# Patient Record
Sex: Female | Born: 1970 | Race: White | Hispanic: No | Marital: Single | Smoking: Former smoker
Health system: Southern US, Community
[De-identification: ages and names within clinical notes are randomized; demographics above are authoritative.]

## PROBLEM LIST (undated history)

## (undated) DIAGNOSIS — F988 Other specified behavioral and emotional disorders with onset usually occurring in childhood and adolescence: Secondary | ICD-10-CM

## (undated) DIAGNOSIS — J301 Allergic rhinitis due to pollen: Secondary | ICD-10-CM

## (undated) DIAGNOSIS — N898 Other specified noninflammatory disorders of vagina: Secondary | ICD-10-CM

## (undated) DIAGNOSIS — Z8052 Family history of malignant neoplasm of bladder: Secondary | ICD-10-CM

## (undated) DIAGNOSIS — Q625 Duplication of ureter: Secondary | ICD-10-CM

## (undated) DIAGNOSIS — E785 Hyperlipidemia, unspecified: Secondary | ICD-10-CM

## (undated) DIAGNOSIS — I10 Essential (primary) hypertension: Secondary | ICD-10-CM

## (undated) DIAGNOSIS — R87619 Unspecified abnormal cytological findings in specimens from cervix uteri: Secondary | ICD-10-CM

## (undated) DIAGNOSIS — E669 Obesity, unspecified: Secondary | ICD-10-CM

## (undated) DIAGNOSIS — K219 Gastro-esophageal reflux disease without esophagitis: Secondary | ICD-10-CM

## (undated) DIAGNOSIS — Z8632 Personal history of gestational diabetes: Secondary | ICD-10-CM

## (undated) DIAGNOSIS — D219 Benign neoplasm of connective and other soft tissue, unspecified: Secondary | ICD-10-CM

## (undated) DIAGNOSIS — F419 Anxiety disorder, unspecified: Secondary | ICD-10-CM

## (undated) DIAGNOSIS — B001 Herpesviral vesicular dermatitis: Secondary | ICD-10-CM

## (undated) DIAGNOSIS — Z800 Family history of malignant neoplasm of digestive organs: Secondary | ICD-10-CM

## (undated) DIAGNOSIS — N840 Polyp of corpus uteri: Secondary | ICD-10-CM

## (undated) DIAGNOSIS — C19 Malignant neoplasm of rectosigmoid junction: Secondary | ICD-10-CM

## (undated) HISTORY — DX: Anxiety disorder, unspecified: F41.9

## (undated) HISTORY — PX: COLONOSCOPY: SHX174

## (undated) HISTORY — DX: Obesity, unspecified: E66.9

## (undated) HISTORY — DX: Other specified noninflammatory disorders of vagina: N89.8

## (undated) HISTORY — DX: Unspecified abnormal cytological findings in specimens from cervix uteri: R87.619

## (undated) HISTORY — DX: Gastro-esophageal reflux disease without esophagitis: K21.9

## (undated) HISTORY — DX: Herpesviral vesicular dermatitis: B00.1

## (undated) HISTORY — DX: Essential (primary) hypertension: I10

## (undated) HISTORY — DX: Family history of malignant neoplasm of bladder: Z80.52

## (undated) HISTORY — PX: WISDOM TOOTH EXTRACTION: SHX21

## (undated) HISTORY — PX: TYMPANOSTOMY: SHX2586

## (undated) HISTORY — DX: Personal history of gestational diabetes: Z86.32

## (undated) HISTORY — DX: Family history of malignant neoplasm of digestive organs: Z80.0

## (undated) HISTORY — DX: Allergic rhinitis due to pollen: J30.1

## (undated) HISTORY — DX: Other specified behavioral and emotional disorders with onset usually occurring in childhood and adolescence: F98.8

## (undated) HISTORY — DX: Polyp of corpus uteri: N84.0

## (undated) HISTORY — DX: Benign neoplasm of connective and other soft tissue, unspecified: D21.9

## (undated) HISTORY — DX: Duplication of ureter: Q62.5

## (undated) HISTORY — DX: Hyperlipidemia, unspecified: E78.5

---
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
## 1999-04-12 ENCOUNTER — Other Ambulatory Visit: Admission: RE | Admit: 1999-04-12 | Discharge: 1999-04-12 | Payer: Self-pay | Admitting: Gynecology

## 2001-04-10 ENCOUNTER — Other Ambulatory Visit: Admission: RE | Admit: 2001-04-10 | Discharge: 2001-04-10 | Payer: Self-pay | Admitting: Family Medicine

## 2002-07-23 ENCOUNTER — Other Ambulatory Visit: Admission: RE | Admit: 2002-07-23 | Discharge: 2002-07-23 | Payer: Self-pay | Admitting: Internal Medicine

## 2004-06-17 ENCOUNTER — Ambulatory Visit: Payer: Self-pay | Admitting: Gynecology

## 2005-03-07 HISTORY — PX: DILATION AND CURETTAGE OF UTERUS: SHX78

## 2005-11-23 ENCOUNTER — Ambulatory Visit: Payer: Self-pay | Admitting: Gynecology

## 2006-01-30 ENCOUNTER — Encounter (INDEPENDENT_AMBULATORY_CARE_PROVIDER_SITE_OTHER): Payer: Self-pay | Admitting: Gynecology

## 2006-01-30 ENCOUNTER — Ambulatory Visit: Payer: Self-pay | Admitting: Gynecology

## 2006-04-07 ENCOUNTER — Ambulatory Visit: Payer: Self-pay | Admitting: Gynecology

## 2006-11-08 ENCOUNTER — Ambulatory Visit: Payer: Self-pay | Admitting: Obstetrics & Gynecology

## 2007-01-24 ENCOUNTER — Ambulatory Visit: Payer: Self-pay | Admitting: Gynecology

## 2007-02-06 ENCOUNTER — Encounter: Payer: Self-pay | Admitting: Obstetrics & Gynecology

## 2007-02-06 ENCOUNTER — Ambulatory Visit: Payer: Self-pay | Admitting: Family Medicine

## 2007-07-23 ENCOUNTER — Ambulatory Visit: Payer: Self-pay | Admitting: Gynecology

## 2008-02-20 ENCOUNTER — Encounter: Payer: Self-pay | Admitting: Obstetrics and Gynecology

## 2008-02-20 ENCOUNTER — Encounter (INDEPENDENT_AMBULATORY_CARE_PROVIDER_SITE_OTHER): Payer: Self-pay | Admitting: Gynecology

## 2008-02-20 ENCOUNTER — Ambulatory Visit: Payer: Self-pay | Admitting: Obstetrics and Gynecology

## 2008-02-20 LAB — CONVERTED CEMR LAB: HCV Ab: NEGATIVE

## 2008-10-01 ENCOUNTER — Ambulatory Visit: Payer: Self-pay | Admitting: Obstetrics and Gynecology

## 2008-10-01 ENCOUNTER — Encounter: Payer: Self-pay | Admitting: Obstetrics & Gynecology

## 2008-10-01 LAB — CONVERTED CEMR LAB: Free T4: 0.93 ng/dL (ref 0.80–1.80)

## 2009-02-23 ENCOUNTER — Ambulatory Visit: Payer: Self-pay | Admitting: Obstetrics and Gynecology

## 2009-02-23 LAB — CONVERTED CEMR LAB: HCV Ab: NEGATIVE

## 2009-02-24 ENCOUNTER — Encounter: Payer: Self-pay | Admitting: Obstetrics and Gynecology

## 2009-03-31 ENCOUNTER — Ambulatory Visit (HOSPITAL_COMMUNITY): Admission: RE | Admit: 2009-03-31 | Discharge: 2009-03-31 | Payer: Self-pay | Admitting: Urology

## 2010-01-12 ENCOUNTER — Ambulatory Visit: Payer: Self-pay | Admitting: Family Medicine

## 2010-03-02 ENCOUNTER — Ambulatory Visit: Payer: Self-pay | Admitting: Obstetrics & Gynecology

## 2010-03-17 ENCOUNTER — Ambulatory Visit (HOSPITAL_COMMUNITY)
Admission: RE | Admit: 2010-03-17 | Discharge: 2010-03-17 | Payer: Self-pay | Source: Home / Self Care | Attending: Family Medicine | Admitting: Family Medicine

## 2010-03-29 LAB — COMPREHENSIVE METABOLIC PANEL
Albumin: 4.5
CO2: 29 mmol/L
Calcium: 9.6 mg/dL
Chloride: 100 mmol/L
Total Protein ELP: 6.8

## 2010-03-29 LAB — LIPID PANEL
Direct LDL: 222
HDL: 51 mg/dL (ref 35–70)

## 2010-03-30 ENCOUNTER — Encounter
Admission: RE | Admit: 2010-03-30 | Discharge: 2010-03-30 | Payer: Self-pay | Source: Home / Self Care | Attending: Obstetrics & Gynecology | Admitting: Obstetrics & Gynecology

## 2010-04-01 ENCOUNTER — Other Ambulatory Visit: Payer: Self-pay | Admitting: Obstetrics & Gynecology

## 2010-04-01 DIAGNOSIS — N6320 Unspecified lump in the left breast, unspecified quadrant: Secondary | ICD-10-CM

## 2010-07-20 NOTE — Assessment & Plan Note (Signed)
NAME:  Colleen Boone, Colleen Boone NO.:  [id number]   MEDICAL RECORD NO.:  [id number]          PATIENT TYPE:  POB   LOCATION:  [facility] at [place]         FACILITY:  [facility]   PHYSICIAN:  Allie Bossier, MD        DATE OF BIRTH:  [birth year]   DATE OF SERVICE:                                  CLINIC NOTE   HISTORY OF PRESENT ILLNESS:  Kanija is a 40 year old divorced white  gravida 2, para 1, abortus 1 woman with a 3 year old son.  She comes in  here for annual exam.  She has no GYN complaints.   PAST MEDICAL HISTORY:  Vaginal cyst previously evaluated by Dr.  McDiarmid with an MRI at [facility] recently.  No workup is necessary  per him, and she is asymptomatic with regard to it.  Current obesity and  history of anxiety and elevated cholesterol.   Her primary care doctor is [facility] in [place].   FAMILY HISTORY:  Positive for stroke in 2 grandmothers and coronary  artery disease in multiple relatives.  She denies family history of  breast, GYN, or colon malignancies.  She had a history of Bell's palsy  in 2003.   PAST SURGICAL HISTORY:  A T and A at age 72 and elective AB done via D  and C in 2006.   REVIEW OF SYSTEMS:  Abstinent since September 2011.  She has bisexual  relationship.  She is an Nutritional therapist at [employer]. A history of  sclerotherapy this year.   SOCIAL HISTORY:  No drug allergies.  She has a latex sensitivity.   MEDICATION:  Xanax on a p.r.n. basis.  She says this was used rarely.  She is on her second Mirena IUD.  Zantac 150 mg daily p.r.n., Valtrex  p.r.n. for cold sores, hydrochlorothiazide 25 mg on a p.r.n. basis (for  swelling).   PHYSICAL EXAMINATION:  GENERAL:  Well-nourished, well-hydrated pleasant  white female.  VITAL SIGNS:  Height 5 feet 4 inches, weight 180, blood pressure 117/94,  and pulse 74.  HEENT:  Normal.  BREASTS:  Normal bilaterally.  HEART:  Regular rate and rhythm.  LUNGS:  Clear to auscultation bilaterally.  ABDOMEN:   Obese and benign.  No palpable hepatosplenomegaly.  EXTERNAL GENITALIA:  Shaved.  No lesions.  Cervix parous.  IUD string  seen.  No ectropion is noted.  No abnormalities are visualized.  She has  first degree to second degree prolapse of her uterus and mild cystocele.  Bimanual exam reveals a normal size and shape, anteverted mobile uterus,  and nonenlarged adnexa.   ASSESSMENT AND PLAN:  Annual exam, check Pap smear, and recommended self-  breast and self-vulvar exams.  We are scheduling a mammogram next month.  Recommended weight loss and that she followup with her family doctor or  her primary care doctor regarding her cholesterol.      Allie Bossier, MD     MCD/MEDQ  D:  03/02/2010  T:  03/03/2010  Job:  [id number]

## 2010-07-20 NOTE — Assessment & Plan Note (Signed)
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  She has had an last examination, a  normal examination, in December 2009 with no significant serious  problems, but she finds that she is becoming very anxious and cannot  really work.  She has had some hair loss.  She is having difficulty  sleeping.  She generally just does not feel well and would like some  help.  In addition to this, although she is not sexually active, she is  questioning about permanent sterilization.  At her age, if she does get  into a relationship, she does not want the chance of having a child.  I  have told her that since she is not in a relationship, why not wait  without doing something permanent at this point, use something like the  NuvaRing, she could not tolerate the oral contraceptives, for a very  short period of time and then think about something like Essure if she  decides on something permanent in the future.  That seemed to be  satisfactory for her.  I told her we would also check her thyroid  because of symptoms she is describing, all could be related to that, so  that screening will be done.  In addition, we have talked about a trial  of low-dose SSRI and we are going to put her on Zoloft 50.  I have told  her about the side effects and the possibility that that might not be  the correct one and we may have to do some switching to find the  appropriate one.  I would think that if the condition continues and  everything, no other symptoms appear that it is possible that a  professional psychological counseling might be  appropriate, but not at  this time.  My impression is mild depression with anxiety and  contraceptive counseling.           ______________________________  Argentina Donovan, MD     PR/MEDQ  D:  10/01/2008  T:  10/02/2008  Job:  [id number]

## 2010-07-20 NOTE — Assessment & Plan Note (Signed)
NAME:  Colleen Boone, PEGG NO.:  [id number]   MEDICAL RECORD NO.:  [id number]          PATIENT TYPE:  POB   LOCATION:  [facility]         FACILITY:  [facility]   PHYSICIAN:  Tinnie Gens, MD        DATE OF BIRTH:  08/06/1970   DATE OF SERVICE:  01/12/2010                                  CLINIC NOTE   CHIEF COMPLAINT:  IUD insertion.   HISTORY OF PRESENT ILLNESS:  The patient is a 40 year old gravida 1,  para 1 who presented to emergency room at [facility].  She has  had heavy cycles for the last 4 years.  She desires Mirena  contraception.  She has had a Mirena in before and had spotting with it;  however, her cycles have gotten so heavy since she has been off it, that  she would like to return to it.  She states she can put with some  spotting as long as her cycles are not quite so heavy.  She has been  significantly heavy over the last 4-5 years, but they were always heavy,  especially when she was a younger child.  This does not seem to be a  truly new phenomenon for her, but she liked her cycles better when she  had the IUD in place.   PHYSICAL EXAMINATION:  VITAL SIGNS:  Her vitals are as noted in the  chart.  GENERAL:  She is a well-developed, well-nourished woman in no acute  distress.  ABDOMEN:  Soft, nontender, nondistended.  GU:  Normal external female genitalia.  BUS normal.  Vagina is pink and  rugated.  Cervix is parous without lesion.   PROCEDURE:  The patient's cervix is cleaned with Betadine x2.  It was  grasped anteriorly with single-tooth tenaculum.  Attempt was made to  pass the sound and this could not be done easily so an os  finder was  used to gain entry into the uterine cavity.  The uterus then sounded to  8 cm.  A Mirena IUD was placed without difficulty.  Strings were trimmed  to 2.5 cm.  The patient tolerated the procedure well.   IMPRESSION:  Need for contraception as well as cycle control.   PLAN:  Status post IUD  insertion.   PLAN:  Return in 4-6 weeks for IUD check, as well as for Pap smear.  Should the patient continue to have abnormal cycles, consider  endometrial sampling.           ______________________________  Tinnie Gens, MD     TP/MEDQ  D:  01/12/2010  T:  01/13/2010  Job:  [id number]

## 2010-07-20 NOTE — Assessment & Plan Note (Signed)
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  She had some problem last year with hair  loss, which seems to be abated now and some anxiety, and she is put on  SSRIs, now on Celexa; and she is doing very well.  She had a Mirena in  the past and was not really thrilled with that and is using NuvaRing at  the present time, although she says that she has no use for it because  she has changed partners.  In any case, she is in for her annual  examination, she has no significant complaints.  No known allergies and  except for the Celexa, the only thing she is on is p.r.n. Valtrex, and  she does take hydrochlorothiazide 25 a day for hypertension.   PHYSICAL EXAMINATION:  VITAL SIGNS:  Today, her blood pressure is 124/87  and pulse is 80, the weight 164, heights 5 feet 4 inches tall.  GENERAL:  A well-developed and well-nourished white female in no acute  distress.  HEENT:  Within normal limits.  NECK:  Supple.  Thyroid symmetrical.  BACK:  Erect.  LUNGS:  Clear to auscultation and percussion.  HEART:  No murmur.  Normal sinus rhythm.  BREASTS:  Symmetrical, somewhat pendulous, large, no axillary or  supraclavicular nodes.  No nipple discharge.  ABDOMEN:  Soft and nontender.  No masses or organomegaly.  EXTREMITIES:  No edema.  No varicosities.  NEUROLOGIC:  DTRs within normal limits.  GENITALIA:  External genitalia is normal.  BUS is notable by swelling  underneath the urethra measuring approximately 3 cm and soft in nature.  I think this is diverticulum, although she seems to be asymptomatic from  this.  Vagina  is clean and well rugated.  The cervix is clean whereas  the uterus is anterior normal size, shape, consistency.  Adnexa could  not be well outlined.   IMPRESSION:  Normal physical examination with the exception of swelling  under the urethra.  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  I am going to get a urine culture and sensitivity to  make sure that; and I have suggested she sees a urologist.  I have  suggested Dr. Alfredo Martinez, and she has not decided on which she  wants to go to, and she also wanted to be screened for sexually  transmitted diseases.  GC and chlamydia were done and the blood work for  hepatitis, syphilis, and human immunodeficiency virus will be done.           ______________________________  Argentina Donovan, MD    PR/MEDQ  D:  02/23/2009  T:  02/24/2009  Job:  [id number]

## 2010-07-20 NOTE — Assessment & Plan Note (Signed)
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  The patient's only complaint was of the  small tiny lesion on her left labia minorus.   PHYSICAL EXAMINATION:  THYROID:  Symmetrical with no dominant masses.  NECK:  Supple.  BREASTS:  Symmetrical with no dominant masses.  No nipple discharge.  ABDOMEN:  Soft, benign, and nontender.  No masses or organomegaly.  EXTERNAL GENITALIA:  Normal.  The exception of a tiny little sebaceous  cyst at the lower pole of the left labia minorus and  BUS within normal  limits.  Introitus marital.  The cervix is clean and parous.  Uterus is  anterior, normal size, shape, and consistency.  The adnexa is normal.  VITAL SIGNS:  The patient's blood pressure 122/83 with a weight of 168  and height of 5 feet 4 inches tall with a pulse of 72.   The patient had previously been on Loestrin 24 Fe.  She is not sexually  active at this point and so she stopped her medication.  Previous Mirena  was removed in last summer because of spotting.  The patient in good and  virtually normal at gynecological examination.           ______________________________  Argentina Donovan, MD     PR/MEDQ  D:  02/20/2008  T:  02/21/2008  Job:  [id number]

## 2010-07-20 NOTE — Assessment & Plan Note (Signed)
NAME:  [name], [name] NO.:  [id number]   MEDICAL RECORD NO.:  [id number]          PATIENT TYPE:  POB   LOCATION:  [facility]         FACILITY:  [facility]   PHYSICIAN:  Ginger Carne, MD DATE OF BIRTH:  12-24-1970   DATE OF SERVICE:  07/23/2007                                  CLINIC NOTE   Colleen Boone returns today to have her Mirena intrauterine device  removed.  The patient has had in for two years principally for birth  control measures. She has had intermittent spotting which she dislikes.  Intrauterine device was removed without difficulty.  The patient was  started on Loestrin FE 24.  She was counseled that she may have some  irregular bleeding for the next month or so due to the changeover in  form of contraception.  The patient will contact us if she has any  further questions.  She is not due for her next Pap smear until December  2009.           ______________________________  Ginger Carne, MD     SHB/MEDQ  D:  07/23/2007  T:  07/23/2007  Job:  [id number]

## 2010-12-16 ENCOUNTER — Encounter: Payer: Self-pay | Admitting: Family Medicine

## 2010-12-16 ENCOUNTER — Ambulatory Visit (INDEPENDENT_AMBULATORY_CARE_PROVIDER_SITE_OTHER): Payer: 59 | Admitting: Family Medicine

## 2010-12-16 VITALS — BP 106/70 | HR 84 | Temp 98.6°F | Ht 63.0 in | Wt 193.2 lb

## 2010-12-16 DIAGNOSIS — K219 Gastro-esophageal reflux disease without esophagitis: Secondary | ICD-10-CM | POA: Insufficient documentation

## 2010-12-16 DIAGNOSIS — Z833 Family history of diabetes mellitus: Secondary | ICD-10-CM | POA: Insufficient documentation

## 2010-12-16 DIAGNOSIS — E785 Hyperlipidemia, unspecified: Secondary | ICD-10-CM | POA: Insufficient documentation

## 2010-12-16 DIAGNOSIS — Z8249 Family history of ischemic heart disease and other diseases of the circulatory system: Secondary | ICD-10-CM

## 2010-12-16 DIAGNOSIS — E669 Obesity, unspecified: Secondary | ICD-10-CM

## 2010-12-16 DIAGNOSIS — I10 Essential (primary) hypertension: Secondary | ICD-10-CM

## 2010-12-16 LAB — COMPREHENSIVE METABOLIC PANEL
Albumin: 4.1 g/dL (ref 3.5–5.2)
CO2: 27 mEq/L (ref 19–32)
Calcium: 9.1 mg/dL (ref 8.4–10.5)
GFR: 87.91 mL/min (ref >60.00)
Glucose, Bld: 95 mg/dL (ref 70–99)
Potassium: 3.8 mEq/L (ref 3.5–5.1)
Sodium: 136 mEq/L (ref 135–145)
Total Bilirubin: 0.8 mg/dL (ref 0.3–1.2)
Total Protein: 7.2 g/dL (ref 6.0–8.3)

## 2010-12-16 LAB — LIPID PANEL: Triglycerides: 121 mg/dL (ref 0.0–149.0)

## 2010-12-16 LAB — TSH: TSH: 1.66 u[IU]/mL (ref 0.35–5.50)

## 2010-12-16 NOTE — Assessment & Plan Note (Signed)
Chronic. Stable. No change today.  States has enough bp meds to last until next visit.

## 2010-12-16 NOTE — Progress Notes (Addendum)
Subjective:    Patient ID: Colleen Boone, female    DOB: 11-29-1970, 40 y.o.   MRN: [id number]  HPI CC: new pt, establish  Weight gain, feeling fuller in abdomen.  previously saw Colleen Boone.  1 HTN - recently restarted lisinopril.  Prior, bp running 130/90s.  H/o edema since pregnancy, h/o GDM, on HCTZ and recently worsening.  2. Weight gain - 20 lbs up in last 1 year.  Back in school - finishing BS.  No heat or cold intolerance, skin or hair changes, diarrhea constipation.  No fatigue.  Occasional eczema.  Feels abd fullness that leads to SOB.  Mother with NASH with varices.  3. HLD - told too high in past, thinks LDL 200s, HDL too low.  Will request records.  H/o depression/anxiety, tapered off sertraline recently.  Caffeine: 1 cup coffee/day Lives with son (1998), 2 dogs Occupation: Charity fundraiser at [employer], working on [employer] Activity: gym 2-3x/wk, cardio, back in school Diet: not lots of vegetables/fruits.  Not much water.  Preventative: Tetanus - utd from work. Ppd every year for work. Flu shot last week. Well woman at [facility] [place]).  1 abnl pap years ago, normal.  Normal mammograms, [place] ok this year. CPE - saw Dr. Zenaida Niece at [facility], but she left.  Medications and allergies reviewed and updated in chart.  Past histories reviewed and updated if relevant as below. Patient Active Problem List  Diagnoses  . HTN (hypertension)  . HLD (hyperlipidemia)   Past Medical History  Diagnosis Date  . History of gestational diabetes   . Anxiety   . GERD (gastroesophageal reflux disease)   . Hay fever   . HTN (hypertension)   . HLD (hyperlipidemia)   . Accessory ureter   . Uterine polyp    Past Surgical History  Procedure Date  . Tonsillectomy 1975   History  Substance Use Topics  . Smoking status: Never Smoker   . Smokeless tobacco: Never Used  . Alcohol Use: Yes     2 glasses red wine nightly   Family History  Problem Relation Age of Onset  .  Hypertension Mother   . Diabetes Mother   . Coronary artery disease Father 65  . Cancer Father     lymphoma  . Hypertension Father   . Hyperlipidemia Father   . Diabetes Father   . Diabetes Brother   . Cancer Maternal Uncle 82    colon  . Stroke Maternal Grandmother     post card cath  . Cancer Maternal Grandmother     leukemia  . Hypertension Maternal Grandmother   . Hypertension Maternal Grandfather   . Stroke Paternal Grandmother   . Hypertension Paternal Grandmother   . Stroke Paternal Grandfather   . Hypertension Paternal Grandfather    Allergies  Allergen Reactions  . Latex Rash  . Aleve (All Day Pain)     Hot flashes   No current outpatient prescriptions on file prior to visit.   Review of Systems  Constitutional: Negative for fever, chills, activity change, appetite change, fatigue and unexpected weight change.  HENT: Negative for hearing loss and neck pain.   Eyes: Negative for visual disturbance.  Respiratory: Positive for shortness of breath. Negative for cough, chest tightness and wheezing.   Cardiovascular: Positive for leg swelling. Negative for chest pain and palpitations.  Gastrointestinal: Negative for nausea, vomiting, abdominal pain, diarrhea, constipation, blood in stool and abdominal distention.  Genitourinary: Negative for hematuria and difficulty urinating.  Musculoskeletal: Negative for myalgias and arthralgias.  Skin: Negative for rash.  Neurological: Negative for dizziness, seizures, syncope and headaches.  Hematological: Does not bruise/bleed easily.  Psychiatric/Behavioral: Negative for dysphoric mood. The patient is not nervous/anxious.        Objective:   Physical Exam  Nursing note and vitals reviewed. Constitutional: She is oriented to person, place, and time. She appears well-developed and well-nourished. No distress.  HENT:  Head: Normocephalic and atraumatic.  Right Ear: External ear normal.  Left Ear: External ear normal.    Nose: Nose normal.  Mouth/Throat: Oropharynx is clear and moist. No oropharyngeal exudate.  Eyes: Conjunctivae and EOM are normal. Pupils are equal, round, and reactive to light. No scleral icterus.  Neck: Normal range of motion. Neck supple. No thyromegaly present.  Cardiovascular: Normal rate, regular rhythm, normal heart sounds and intact distal pulses.   No murmur heard. Pulses:      Radial pulses are 2+ on the right side, and 2+ on the left side.  Pulmonary/Chest: Effort normal and breath sounds normal. No respiratory distress. She has no wheezes. She has no rales.  Abdominal: Soft. Bowel sounds are normal. She exhibits no distension and no mass. There is no tenderness. There is no rebound and no guarding.  Musculoskeletal: Normal range of motion.  Lymphadenopathy:    She has no cervical adenopathy.  Neurological: She is alert and oriented to person, place, and time.       CN grossly intact, station and gait intact  Skin: Skin is warm and dry. No rash noted.  Psychiatric: She has a normal mood and affect. Her behavior is normal. Judgment and thought content normal.          Assessment & Plan:

## 2010-12-16 NOTE — Assessment & Plan Note (Signed)
Discussed healthy eating, increasing activity. Pt thinks she could increase water. Discussed preparing/planning meals instead of convenience eating. Avoid phentermine given personal hx HTN and fmhx CAD. ?orlistat

## 2010-12-16 NOTE — Patient Instructions (Signed)
Work on more water in diet. Keep up good work with gym. More fruits and vegetables. We will check blood work today.  Good to see you, call us with questions. Return in 1-2 months for follow up on weight and abdominal fullness sensation.

## 2010-12-16 NOTE — Assessment & Plan Note (Signed)
Stable on omeprazole. 

## 2010-12-16 NOTE — Assessment & Plan Note (Signed)
Check FLP today. Will discuss med based on values. Early fmhx cad.  Goal ideally <100

## 2010-12-27 ENCOUNTER — Telehealth: Payer: Self-pay | Admitting: *Deleted

## 2010-12-27 NOTE — Telephone Encounter (Signed)
Pt is asking for combo of phenterimine and topamax.  She says she discussed this with you the last time she was in.  [facility] [address]  She would just like to try this.

## 2010-12-27 NOTE — Telephone Encounter (Signed)
i would avoid phentermine for now given fmhx cad and personal hx HTN (although well controlled). We could try topamax alone, or orlistat (alli).  Is she familiar with either of these?

## 2010-12-28 ENCOUNTER — Encounter: Payer: Self-pay | Admitting: Family Medicine

## 2010-12-28 MED ORDER — TOPIRAMATE 25 MG PO TABS: 25.0000 mg | ORAL_TABLET | Freq: Every day | ORAL | Status: DC

## 2010-12-28 NOTE — Telephone Encounter (Signed)
Message left notifying patient that you wanted her to avoid the phentermine for now. I advised to call me back and let me know if she is willing to try topamax alone or orlistat and we would send one in for her.

## 2010-12-28 NOTE — Telephone Encounter (Signed)
Patient called and left message that she is willing to try the Topamax. [facility] [address]

## 2010-12-28 NOTE — Telephone Encounter (Signed)
Has f/u with me 11/15. have sent in topamax 25mg  once daily. Remind should not get pregnant on this medication. May cause some somnolence, dizziness - start at night.

## 2010-12-29 NOTE — Telephone Encounter (Signed)
Message left notifying patient of Rx. Advised to start it at night due to drowsiness and dizziness. Advised no pregnancy while taking medication. Instructed to keep follow up and call with any questions or concerns.

## 2011-01-20 ENCOUNTER — Ambulatory Visit: Payer: 59 | Admitting: Family Medicine

## 2011-01-25 ENCOUNTER — Other Ambulatory Visit: Payer: Self-pay | Admitting: *Deleted

## 2011-01-25 NOTE — Telephone Encounter (Signed)
Combo pill only comes in 10/12.5 or 20/25, not in 10/25.  Ok to do lisinopril hctz 10/12.5 if pt able to keep track of bp at home and notify us if staying elevated.  If pt desires may send this in (placed in chart).  Does she want 1 or 3 mo supply?

## 2011-01-25 NOTE — Telephone Encounter (Signed)
Patient was requesting a refill of her lisinopril and HCTZ. She was requesting the combo pill so she only has to take 1 pill. Is this change okay?

## 2011-01-26 ENCOUNTER — Other Ambulatory Visit: Payer: Self-pay | Admitting: Obstetrics and Gynecology

## 2011-01-26 MED ORDER — LISINOPRIL-HYDROCHLOROTHIAZIDE 10-12.5 MG PO TABS: 1.0000 | ORAL_TABLET | Freq: Every day | ORAL | Status: DC

## 2011-01-26 NOTE — Telephone Encounter (Signed)
Please notify pt of dosage changes below as she was taking lisinopril 10 and hctz 25 and combo pill only has lisinopril 10mg  and hctz 12.5mg .

## 2011-01-26 NOTE — Telephone Encounter (Signed)
Left message on VM that rx was sent to pharmacy and to call if any questions, also advised pt to call if BP remains elevated.

## 2011-02-21 ENCOUNTER — Ambulatory Visit (INDEPENDENT_AMBULATORY_CARE_PROVIDER_SITE_OTHER): Payer: 59 | Admitting: Family Medicine

## 2011-02-21 ENCOUNTER — Encounter: Payer: Self-pay | Admitting: Family Medicine

## 2011-02-21 VITALS — BP 118/78 | HR 72 | Temp 98.5°F | Ht 63.0 in | Wt 184.8 lb

## 2011-02-21 DIAGNOSIS — I10 Essential (primary) hypertension: Secondary | ICD-10-CM

## 2011-02-21 DIAGNOSIS — E669 Obesity, unspecified: Secondary | ICD-10-CM

## 2011-02-21 DIAGNOSIS — E785 Hyperlipidemia, unspecified: Secondary | ICD-10-CM

## 2011-02-21 LAB — BASIC METABOLIC PANEL
Chloride: 103 mEq/L (ref 96–112)
GFR: 78.36 mL/min (ref >60.00)
Potassium: 4 mEq/L (ref 3.5–5.1)

## 2011-02-21 NOTE — Progress Notes (Signed)
  Subjective:    Patient ID: Colleen Boone, female    DOB: 1970-03-20, 40 y.o.   MRN: [id number]  HPI CC: 1 mo f/u  HTN - No HA, vision changes, CP/tightness, SOB, leg swelling.  Compliant with meds.  Lisinopril HCTZ combo pill.  No low blood pressures, dizziness. BP Readings from Last 3 Encounters:  02/21/11 118/78  12/16/10 106/70   Weight loss - diet. Wt Readings from Last 3 Encounters:  02/21/11 184 lb 12 oz (83.802 kg)  12/16/10 193 lb 4 oz (87.658 kg)   HLD - discussed blood work.  Goal LDL ideally <100 given fmhx.  Too high.  Has changed diet.  Review of Systems per HPI    Objective:   Physical Exam  Nursing note and vitals reviewed. Constitutional: She appears well-developed and well-nourished. No distress.  HENT:  Head: Normocephalic and atraumatic.  Mouth/Throat: Oropharynx is clear and moist. No oropharyngeal exudate.  Eyes: Conjunctivae and EOM are normal. Pupils are equal, round, and reactive to light. No scleral icterus.  Neck: Normal range of motion. Neck supple. Carotid bruit is not present. No thyromegaly present.  Cardiovascular: Normal rate, regular rhythm, normal heart sounds and intact distal pulses.   No murmur heard. Pulmonary/Chest: Effort normal and breath sounds normal. No respiratory distress. She has no wheezes. She has no rales.  Musculoskeletal: She exhibits no edema.  Lymphadenopathy:    She has no cervical adenopathy.  Skin: Skin is warm and dry. No rash noted.      Assessment & Plan:

## 2011-02-21 NOTE — Assessment & Plan Note (Signed)
Reviewed blood work. Goal LDL <100 given fmhx, too high at 190s. Discussed lifestyle changes, with weight loss should help numbers. Recheck 4 mo and reassess.

## 2011-02-21 NOTE — Assessment & Plan Note (Signed)
Chronic. Stable. Good control on combo med. Check BMP today to monitor Cr, K.

## 2011-02-21 NOTE — Patient Instructions (Signed)
check into tetanus. Blood work to check kidneys today. Return in 4 months for repeat cholesterol levels, and we will touch base then on med vs continued lifestyle changes. Good to see you today, Colleen Boone!

## 2011-02-21 NOTE — Assessment & Plan Note (Addendum)
Did not start topamax 2/2 side effects. With diet has lost 9 lbs. Body mass index is 32.73 kg/(m^2).

## 2011-03-02 ENCOUNTER — Telehealth: Payer: Self-pay | Admitting: Internal Medicine

## 2011-03-02 NOTE — Telephone Encounter (Signed)
FYI- Lab scheduled for 03-03-11 and PPD scheduled for 03-04-11.

## 2011-03-02 NOTE — Telephone Encounter (Signed)
Patient called and stated she needs a Hep. B titer and another TB test for school.  She has to have 2 negatives in a year of the TB test.

## 2011-03-03 ENCOUNTER — Other Ambulatory Visit (INDEPENDENT_AMBULATORY_CARE_PROVIDER_SITE_OTHER): Payer: 59

## 2011-03-03 DIAGNOSIS — Z9189 Other specified personal risk factors, not elsewhere classified: Secondary | ICD-10-CM

## 2011-03-03 DIAGNOSIS — IMO0001 Reserved for inherently not codable concepts without codable children: Secondary | ICD-10-CM

## 2011-03-03 NOTE — Telephone Encounter (Signed)
noted 

## 2011-03-04 ENCOUNTER — Ambulatory Visit (INDEPENDENT_AMBULATORY_CARE_PROVIDER_SITE_OTHER): Payer: 59 | Admitting: *Deleted

## 2011-03-04 DIAGNOSIS — Z111 Encounter for screening for respiratory tuberculosis: Secondary | ICD-10-CM

## 2011-03-04 LAB — HEPATITIS B SURFACE ANTIBODY, QUANTITATIVE: Hepatitis B-Post: 56.5 m[IU]/mL

## 2011-03-04 MED ORDER — TUBERCULIN PPD 5 UNIT/0.1ML ID SOLN: 5.0000 [IU] | Freq: Once | INTRADERMAL | Status: DC

## 2011-03-29 ENCOUNTER — Encounter: Payer: Self-pay | Admitting: Obstetrics and Gynecology

## 2011-03-29 ENCOUNTER — Ambulatory Visit (INDEPENDENT_AMBULATORY_CARE_PROVIDER_SITE_OTHER): Payer: 59 | Admitting: Obstetrics and Gynecology

## 2011-03-29 VITALS — BP 118/87 | HR 84 | Ht 63.0 in | Wt 182.0 lb

## 2011-03-29 DIAGNOSIS — Z113 Encounter for screening for infections with a predominantly sexual mode of transmission: Secondary | ICD-10-CM

## 2011-03-29 DIAGNOSIS — Z1272 Encounter for screening for malignant neoplasm of vagina: Secondary | ICD-10-CM

## 2011-03-29 DIAGNOSIS — Z01419 Encounter for gynecological examination (general) (routine) without abnormal findings: Secondary | ICD-10-CM

## 2011-03-29 DIAGNOSIS — Z3049 Encounter for surveillance of other contraceptives: Secondary | ICD-10-CM

## 2011-03-29 MED ORDER — MEDROXYPROGESTERONE ACETATE 150 MG/ML IM SUSP
150.0000 mg | INTRAMUSCULAR | Status: DC
  Administered 2011-03-29 – 2011-09-15 (×2): 150 mg via INTRAMUSCULAR

## 2011-03-29 MED ORDER — FLUCONAZOLE 150 MG PO TABS: 150.0000 mg | ORAL_TABLET | Freq: Once | ORAL | Status: AC

## 2011-03-29 NOTE — Progress Notes (Signed)
  Subjective:     Colleen Boone is a 41 y.o. caucasian female with LMP 03/14/2011 and BMI 32 and is here for a comprehensive physical exam. The patient reports removing her IUD accidently (thinking it was the tampon strings). Patient does not desire having the IUD replaced. Patient is interested in receiving depo-provera for birth control. Patient also c/o abnormal discharge with pruritis and thinkks she may have a yeast infection. Patient desires full STD testing. Patient also reports experiencing leakage of urine at times with coughing, sneezing or laughing. She is not interested in any interventions at this time for this problem  History   Social History  . Marital Status: Divorced    Spouse Name: N/A    Number of Children: N/A  . Years of Education: N/A   Occupational History  . Not on file.   Social History Main Topics  . Smoking status: Never Smoker   . Smokeless tobacco: Never Used  . Alcohol Use: Yes     2 glasses red wine nightly  . Drug Use: No  . Sexually Active: Yes -- Female partner(s)    Birth Control/ Protection: Condom   Other Topics Concern  . 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  Not much water.   Health Maintenance  Topic Date Due  . Pap Smear  03/12/1988  . Tetanus/tdap  03/12/1989  . Influenza Vaccine  12/06/2011       Review of Systems A comprehensive review of systems was negative.   Objective:   GENERAL: Well-developed, well-nourished female in no acute distress.  HEENT: Normocephalic, atraumatic. Sclerae anicteric.  NECK: Supple. Normal thyroid.  LUNGS: Clear to auscultation bilaterally.  HEART: Regular rate and rhythm. BREASTS: Symmetric in size. No palpable masses or lymphadenopathy, skin changes, or nipple drainage. ABDOMEN: Soft, nontender, nondistended. No  organomegaly. PELVIC: Normal external female genitalia. Vagina is pink and rugated. Normal appearing cervix. Uterus is normal in size. No adnexal mass or tenderness. Small cystocele. White adherent discharge consistent with yeast EXTREMITIES: No cyanosis, clubbing, or edema, 2+ distal pulses.    Assessment:    Healthy female exam. Here for annual exam     Plan:    Pap smear and culture collected Patient to be stested for HIV, Hep B and C, RPR Wet prep collected- exam consistent with yeast infection- Rx Diflucan provided Referral for screening mammogram provided Patient to initiate depo-provera. Patient informed of the potential 5 lb weight gain with this form of birth control Patient will be contacted with any abnormal results Patient advised to continue monthly breast and vulva exams Patient advised to exercise regularly (150 min/week) and eat high fiber/low fat diet Patient also advised to routinely perform kegel exercises and return when stress incontinence becomes problematic See After Visit Summary for Counseling Recommendations

## 2011-03-29 NOTE — Patient Instructions (Signed)

## 2011-03-30 LAB — HEPATITIS B SURFACE ANTIGEN: Hepatitis B Surface Ag: NEGATIVE

## 2011-03-30 LAB — WET PREP, GENITAL

## 2011-03-30 LAB — RPR

## 2011-03-30 LAB — HIV ANTIBODY (ROUTINE TESTING W REFLEX): HIV: NONREACTIVE

## 2011-04-18 ENCOUNTER — Other Ambulatory Visit: Payer: Self-pay | Admitting: Obstetrics & Gynecology

## 2011-06-22 ENCOUNTER — Ambulatory Visit (INDEPENDENT_AMBULATORY_CARE_PROVIDER_SITE_OTHER): Payer: 59 | Admitting: *Deleted

## 2011-06-22 ENCOUNTER — Ambulatory Visit: Payer: 59

## 2011-06-22 DIAGNOSIS — Z3049 Encounter for surveillance of other contraceptives: Secondary | ICD-10-CM

## 2011-06-22 NOTE — Progress Notes (Signed)
Patient is here for Depo Provera injection, she is doing well with this, has had mild spotting but no heavy bleeding.

## 2011-06-24 ENCOUNTER — Other Ambulatory Visit: Payer: 59

## 2011-09-14 ENCOUNTER — Ambulatory Visit: Payer: 59

## 2011-09-15 ENCOUNTER — Ambulatory Visit (INDEPENDENT_AMBULATORY_CARE_PROVIDER_SITE_OTHER): Payer: 59 | Admitting: *Deleted

## 2011-09-15 DIAGNOSIS — Z3049 Encounter for surveillance of other contraceptives: Secondary | ICD-10-CM

## 2011-09-15 DIAGNOSIS — Z3042 Encounter for surveillance of injectable contraceptive: Secondary | ICD-10-CM

## 2011-11-29 ENCOUNTER — Ambulatory Visit (INDEPENDENT_AMBULATORY_CARE_PROVIDER_SITE_OTHER): Payer: 59 | Admitting: Obstetrics & Gynecology

## 2011-11-29 ENCOUNTER — Encounter: Payer: Self-pay | Admitting: Obstetrics & Gynecology

## 2011-11-29 VITALS — BP 107/79 | HR 98 | Ht 63.0 in | Wt 185.0 lb

## 2011-11-29 DIAGNOSIS — N921 Excessive and frequent menstruation with irregular cycle: Secondary | ICD-10-CM | POA: Insufficient documentation

## 2011-11-29 NOTE — Patient Instructions (Signed)
Return to clinic for any scheduled appointments or for any gynecologic concerns as needed.   

## 2011-11-29 NOTE — Progress Notes (Signed)
History:  41 y.o. G2P1 here today for report of breakthrough bleeding on Depo Provera. Received last shot on 09/15/11. Since then, irregular bleeding ensued mostly small amount/brown discharge daily. This was her fourth injection, no problems after the other shots. Had multiple negative home UPTs.  No other symptoms.  The following portions of the patient's history were reviewed and updated as appropriate: allergies, current medications, past family history, past medical history, past social history, past surgical history and problem list.  Review of Systems:  Pertinent items are noted in HPI.  Objective:  Physical Exam Blood pressure 107/79, pulse 98, height 5\' 3"  (1.6 m), weight 185 lb (83.915 kg). Gen: NAD Abd: Soft, nontender and nondistended Pelvic: Normal appearing external genitalia; normal appearing vaginal mucosa and cervix.  Brown discharge.  Small uterus, no other palpable masses, no uterine or adnexal tenderness  Assessment & Plan:  GC/Chlam test done Will order ultrasound to evaluate for structural anomalies If workup is negative, will try a month of Lo-Loestrin to see if this could help with the bleeding. If bleeding continues, may need further evaluation.

## 2011-11-30 LAB — GC/CHLAMYDIA PROBE AMP, GENITAL: GC Probe Amp, Genital: NEGATIVE

## 2011-12-06 ENCOUNTER — Ambulatory Visit (HOSPITAL_COMMUNITY)
Admission: RE | Admit: 2011-12-06 | Discharge: 2011-12-06 | Disposition: A | Discharge status: Home / Self Care | Payer: 59 | Source: Ambulatory Visit | Attending: Obstetrics & Gynecology | Admitting: Obstetrics & Gynecology

## 2011-12-06 DIAGNOSIS — N921 Excessive and frequent menstruation with irregular cycle: Secondary | ICD-10-CM

## 2011-12-06 DIAGNOSIS — D219 Benign neoplasm of connective and other soft tissue, unspecified: Secondary | ICD-10-CM

## 2011-12-06 DIAGNOSIS — N925 Other specified irregular menstruation: Secondary | ICD-10-CM | POA: Insufficient documentation

## 2011-12-06 DIAGNOSIS — N949 Unspecified condition associated with female genital organs and menstrual cycle: Secondary | ICD-10-CM | POA: Insufficient documentation

## 2011-12-06 DIAGNOSIS — N938 Other specified abnormal uterine and vaginal bleeding: Secondary | ICD-10-CM | POA: Insufficient documentation

## 2011-12-06 DIAGNOSIS — D251 Intramural leiomyoma of uterus: Secondary | ICD-10-CM | POA: Insufficient documentation

## 2011-12-06 HISTORY — DX: Benign neoplasm of connective and other soft tissue, unspecified: D21.9

## 2011-12-07 ENCOUNTER — Encounter: Payer: Self-pay | Admitting: Obstetrics & Gynecology

## 2011-12-15 ENCOUNTER — Ambulatory Visit (INDEPENDENT_AMBULATORY_CARE_PROVIDER_SITE_OTHER): Payer: 59 | Admitting: *Deleted

## 2011-12-15 DIAGNOSIS — Z3049 Encounter for surveillance of other contraceptives: Secondary | ICD-10-CM

## 2011-12-15 MED ORDER — MEDROXYPROGESTERONE ACETATE 150 MG/ML IM SUSP
150.0000 mg | INTRAMUSCULAR | Status: DC
  Administered 2011-12-15: 150 mg via INTRAMUSCULAR

## 2012-01-04 ENCOUNTER — Other Ambulatory Visit: Payer: Self-pay | Admitting: Internal Medicine

## 2012-01-04 ENCOUNTER — Other Ambulatory Visit: Payer: Self-pay | Admitting: Family Medicine

## 2012-01-04 DIAGNOSIS — Z0000 Encounter for general adult medical examination without abnormal findings: Secondary | ICD-10-CM

## 2012-01-04 DIAGNOSIS — I10 Essential (primary) hypertension: Secondary | ICD-10-CM

## 2012-01-04 DIAGNOSIS — E785 Hyperlipidemia, unspecified: Secondary | ICD-10-CM

## 2012-01-06 ENCOUNTER — Other Ambulatory Visit (INDEPENDENT_AMBULATORY_CARE_PROVIDER_SITE_OTHER): Payer: 59

## 2012-01-06 DIAGNOSIS — E785 Hyperlipidemia, unspecified: Secondary | ICD-10-CM

## 2012-01-06 DIAGNOSIS — I10 Essential (primary) hypertension: Secondary | ICD-10-CM

## 2012-01-06 DIAGNOSIS — Z0000 Encounter for general adult medical examination without abnormal findings: Secondary | ICD-10-CM

## 2012-01-06 LAB — CBC WITH DIFFERENTIAL/PLATELET
Basophils Relative: 0.5 % (ref 0.0–3.0)
Eosinophils Relative: 2.6 % (ref 0.0–5.0)
HCT: 41.8 % (ref 36.0–46.0)
Hemoglobin: 13.9 g/dL (ref 12.0–15.0)
Lymphs Abs: 2.3 10*3/uL (ref 0.7–4.0)
MCV: 92.4 fl (ref 78.0–100.0)
Monocytes Absolute: 0.5 10*3/uL (ref 0.1–1.0)
Monocytes Relative: 7.1 % (ref 3.0–12.0)
Neutro Abs: 4.3 10*3/uL (ref 1.4–7.7)
Platelets: 278 10*3/uL (ref 150.0–400.0)
WBC: 7.4 10*3/uL (ref 4.5–10.5)

## 2012-01-06 LAB — BASIC METABOLIC PANEL
BUN: 13 mg/dL (ref 6–23)
Chloride: 106 mEq/L (ref 96–112)
Potassium: 4.2 mEq/L (ref 3.5–5.1)
Sodium: 137 mEq/L (ref 135–145)

## 2012-01-06 LAB — LIPID PANEL: VLDL: 27.2 mg/dL (ref 0.0–40.0)

## 2012-01-06 LAB — LDL CHOLESTEROL, DIRECT: Direct LDL: 196 mg/dL

## 2012-01-09 ENCOUNTER — Encounter: Payer: Self-pay | Admitting: Family Medicine

## 2012-01-09 ENCOUNTER — Ambulatory Visit (INDEPENDENT_AMBULATORY_CARE_PROVIDER_SITE_OTHER): Payer: 59 | Admitting: Family Medicine

## 2012-01-09 VITALS — BP 130/84 | HR 76 | Temp 98.3°F | Ht 63.0 in | Wt 187.8 lb

## 2012-01-09 DIAGNOSIS — I10 Essential (primary) hypertension: Secondary | ICD-10-CM

## 2012-01-09 DIAGNOSIS — R7303 Prediabetes: Secondary | ICD-10-CM | POA: Insufficient documentation

## 2012-01-09 DIAGNOSIS — E785 Hyperlipidemia, unspecified: Secondary | ICD-10-CM

## 2012-01-09 DIAGNOSIS — Z23 Encounter for immunization: Secondary | ICD-10-CM

## 2012-01-09 DIAGNOSIS — E669 Obesity, unspecified: Secondary | ICD-10-CM

## 2012-01-09 DIAGNOSIS — R7309 Other abnormal glucose: Secondary | ICD-10-CM

## 2012-01-09 DIAGNOSIS — Z0000 Encounter for general adult medical examination without abnormal findings: Secondary | ICD-10-CM

## 2012-01-09 DIAGNOSIS — R739 Hyperglycemia, unspecified: Secondary | ICD-10-CM

## 2012-01-09 MED ORDER — LISINOPRIL-HYDROCHLOROTHIAZIDE 10-12.5 MG PO TABS: 1.0000 | ORAL_TABLET | Freq: Every day | ORAL | Status: DC

## 2012-01-09 MED ORDER — SIMVASTATIN 40 MG PO TABS: 40.0000 mg | ORAL_TABLET | Freq: Every day | ORAL | Status: DC

## 2012-01-09 NOTE — Assessment & Plan Note (Signed)
Reviewed blood work. Discussed goal <130, ideally <100 given fmhx. Will start simvastatin, rtc for lab visit to recheck FLP. Pt agrees with plan.

## 2012-01-09 NOTE — Assessment & Plan Note (Signed)
Preventative protocols reviewed and updated unless pt declined. Discussed healthy diet and lifestyle.  

## 2012-01-09 NOTE — Assessment & Plan Note (Signed)
Check A1c when returns fasting.  Strong fmhx DM.

## 2012-01-09 NOTE — Progress Notes (Signed)
Subjective:    Patient ID: Colleen Boone, female    DOB: 07-07-1970, 41 y.o.   MRN: [id number]  HPI CC: CPE  Here for annual exam.  HTN - No HA, vision changes, CP/tightness, SOB, leg swelling.  Tolerant of lisinopril hctz.  HLD - has been on chol med in past, not recently.  Tried lipitor, caused fatigue.  Wt Readings from Last 3 Encounters:  01/09/12 187 lb 12 oz (85.163 kg)  11/29/11 185 lb (83.915 kg)  03/29/11 182 lb (82.555 kg)   Seat belt use discussed. Sunscreen use discussed.  Preventative:  Tetanus - utd from work.  Requests Tdap today. TB test every year for work - this year getting blood work.  Flu shot 2 weeks ago  Well woman at [facility]). 1 abnl pap years ago, normal. Breakthrough bleeding leading to dx of fibroids.  On depo for contraception.  Treated with 1 mo low dose OCP.  Planning on endometrial bx if continued bleeding. Normal mammograms, [place] ok last year.  Due for f/u of this.  Caffeine: 1 cup coffee/day Lives with son (1998), 2 dogs Occupation: Charity fundraiser at [employer], working on [employer] Activity: gym 2-3x/wk, cardio, back in school Diet: not lots of fruits.  Drinks water.  Medications and allergies reviewed and updated in chart.  Past histories reviewed and updated if relevant as below. Patient Active Problem List  Diagnosis  . HTN (hypertension)  . HLD (hyperlipidemia)  . Obesity  . GERD (gastroesophageal reflux disease)  . Family history of diabetes mellitus  . Family history of early CAD  . Breakthrough bleeding on depo provera  . Fibroids  . Healthcare maintenance   Past Medical History  Diagnosis Date  . History of gestational diabetes   . Anxiety     tapered off sertraline, stable.  Marland Kitchen GERD (gastroesophageal reflux disease)   . Hay fever   . HTN (hypertension)   . HLD (hyperlipidemia)   . Accessory ureter   . Uterine polyp   . Obesity   . Vaginal cyst   . Abnormal Pap smear of cervix   . Fibroids 12/06/2011   Ultrasound showed: 8.8 x 4.6 x 5.6 cm uterus. 2.0 cm intramural fibroid in the uterine fundus, second intramural 1.7 cm fibroid in the right anterior upper corpus. EM 5 mm, no focal lesion. Normal ovaries.    Past Surgical History  Procedure Date  . Tonsillectomy 1975  . Dilation and curettage of uterus 2007  . Wisdom tooth extraction      x4   History  Substance Use Topics  . Smoking status: Never Smoker   . Smokeless tobacco: Never Used  . Alcohol Use: Yes     Comment: 2 glasses red wine nightly   Family History  Problem Relation Age of Onset  . Hypertension Mother   . Diabetes Mother   . Other Mother     NASH with varices  . Coronary artery disease Father 53    massive MI  . Cancer Father 42    lymphoma  . Hypertension Father   . Hyperlipidemia Father   . Diabetes Father   . Diabetes Brother   . Cancer Maternal Uncle 57    colon  . Stroke Maternal Grandmother     post card cath  . Cancer Maternal Grandmother     leukemia and polycythemia  . Hypertension Maternal Grandmother   . Hypertension Maternal Grandfather   . Stroke Paternal Grandmother   . Hypertension Paternal  Grandmother   . Stroke Paternal Grandfather   . Hypertension Paternal Grandfather   . Liver disease Mother     NASH, ITP   Allergies  Allergen Reactions  . Latex Rash  . Aleve (Naproxen Sodium)     Hot flashes   Current Outpatient Prescriptions on File Prior to Visit  Medication Sig Dispense Refill  . diphenhydrAMINE (BENADRYL) 25 mg capsule Take 25 mg by mouth as needed.        Marland Kitchen lisinopril-hydrochlorothiazide (PRINZIDE,ZESTORETIC) 10-12.5 MG per tablet TAKE 1 TABLET BY MOUTH DAILY.  30 tablet  3  . omeprazole (PRILOSEC) 20 MG capsule Take 20 mg by mouth daily.         Current Facility-Administered Medications on File Prior to Visit  Medication Dose Route Frequency Provider Last Rate Last Dose  . medroxyPROGESTERone (DEPO-PROVERA) injection 150 mg  150 mg Intramuscular Q90 days Catalina Antigua, MD   150 mg at 09/15/11 1342  . tuberculin injection 5 Units  5 Units Intradermal Once Eustaquio Boyden, MD         Review of Systems  Constitutional: Negative for fever, chills, activity change, appetite change and fatigue.  HENT: Negative for hearing loss and neck pain.   Eyes: Negative for visual disturbance.  Respiratory: Positive for cough (wonders if due to ACEI, but not bothersome.). Negative for chest tightness, shortness of breath and wheezing.   Cardiovascular: Negative for chest pain, palpitations and leg swelling.  Gastrointestinal: Positive for blood in stool (minimal when wiping). Negative for nausea, vomiting, abdominal pain, diarrhea, constipation and abdominal distention.  Genitourinary: Negative for hematuria and difficulty urinating.  Musculoskeletal: Negative for myalgias and arthralgias.  Skin: Negative for rash.  Neurological: Negative for dizziness, seizures, syncope and headaches.  Hematological: Does not bruise/bleed easily.  Psychiatric/Behavioral: Negative for dysphoric mood. The patient is not nervous/anxious.        Objective:   Physical Exam  Nursing note and vitals reviewed. Constitutional: She is oriented to person, place, and time. She appears well-developed and well-nourished. No distress.  HENT:  Head: Normocephalic and atraumatic.  Right Ear: Hearing, tympanic membrane, external ear and ear canal normal.  Left Ear: Hearing, tympanic membrane, external ear and ear canal normal.  Nose: Nose normal.  Mouth/Throat: Uvula is midline, oropharynx is clear and moist and mucous membranes are normal. No oropharyngeal exudate, posterior oropharyngeal edema, posterior oropharyngeal erythema or tonsillar abscesses.  Eyes: Conjunctivae normal and EOM are normal. Pupils are equal, round, and reactive to light. No scleral icterus.  Neck: Normal range of motion. Neck supple.  Cardiovascular: Normal rate, regular rhythm, normal heart sounds and intact  distal pulses.   No murmur heard. Pulses:      Radial pulses are 2+ on the right side, and 2+ on the left side.  Pulmonary/Chest: Effort normal and breath sounds normal. No respiratory distress. She has no wheezes. She has no rales.  Abdominal: Soft. Bowel sounds are normal. She exhibits no distension and no mass. There is no tenderness. There is no rebound and no guarding.  Musculoskeletal: Normal range of motion. She exhibits no edema.  Lymphadenopathy:    She has no cervical adenopathy.  Neurological: She is alert and oriented to person, place, and time.       CN grossly intact, station and gait intact  Skin: Skin is warm and dry. No rash noted.  Psychiatric: She has a normal mood and affect. Her behavior is normal. Judgment and thought content normal.  Assessment & Plan:

## 2012-01-09 NOTE — Patient Instructions (Addendum)
Start simvastatin 40mg  daily for cholesterol.  Your LDL was 196 today. Lisinopril HCTZ refilled today. Good to see you, call us with questions. Work on activity and lifestyle changes for cholesterol levels. Return in 6 months for recheck cholesterol levels and A1c. Tdap today.

## 2012-01-09 NOTE — Assessment & Plan Note (Signed)
Chronic, stable.  Continue med.  Cough not bothersome.  If becomes so, low threshold to try ARB. BP Readings from Last 3 Encounters:  01/09/12 130/84  11/29/11 107/79  03/29/11 118/87

## 2012-01-09 NOTE — Addendum Note (Signed)
Addended by: Josph Macho A on: 01/09/2012 09:41 AM   Modules accepted: Orders

## 2012-01-09 NOTE — Assessment & Plan Note (Signed)
Discussed healthy active lifestyle and importance of weight loss for current medical issues as well as to lower risk of DM.

## 2012-02-06 ENCOUNTER — Emergency Department (HOSPITAL_COMMUNITY): Payer: 59

## 2012-02-06 ENCOUNTER — Other Ambulatory Visit: Payer: Self-pay

## 2012-02-06 ENCOUNTER — Emergency Department (HOSPITAL_COMMUNITY)
Admission: EM | Admit: 2012-02-06 | Discharge: 2012-02-06 | Disposition: A | Discharge status: Home / Self Care | Payer: 59 | Attending: Emergency Medicine | Admitting: Emergency Medicine

## 2012-02-06 DIAGNOSIS — I10 Essential (primary) hypertension: Secondary | ICD-10-CM | POA: Insufficient documentation

## 2012-02-06 DIAGNOSIS — M549 Dorsalgia, unspecified: Secondary | ICD-10-CM | POA: Insufficient documentation

## 2012-02-06 DIAGNOSIS — R1010 Upper abdominal pain, unspecified: Secondary | ICD-10-CM

## 2012-02-06 DIAGNOSIS — F411 Generalized anxiety disorder: Secondary | ICD-10-CM | POA: Insufficient documentation

## 2012-02-06 DIAGNOSIS — E7800 Pure hypercholesterolemia, unspecified: Secondary | ICD-10-CM | POA: Insufficient documentation

## 2012-02-06 DIAGNOSIS — Q628 Other congenital malformations of ureter: Secondary | ICD-10-CM | POA: Insufficient documentation

## 2012-02-06 DIAGNOSIS — Z8632 Personal history of gestational diabetes: Secondary | ICD-10-CM | POA: Insufficient documentation

## 2012-02-06 DIAGNOSIS — K219 Gastro-esophageal reflux disease without esophagitis: Secondary | ICD-10-CM | POA: Insufficient documentation

## 2012-02-06 DIAGNOSIS — M25519 Pain in unspecified shoulder: Secondary | ICD-10-CM | POA: Insufficient documentation

## 2012-02-06 DIAGNOSIS — R079 Chest pain, unspecified: Secondary | ICD-10-CM | POA: Insufficient documentation

## 2012-02-06 DIAGNOSIS — E669 Obesity, unspecified: Secondary | ICD-10-CM | POA: Insufficient documentation

## 2012-02-06 DIAGNOSIS — J309 Allergic rhinitis, unspecified: Secondary | ICD-10-CM | POA: Insufficient documentation

## 2012-02-06 LAB — CBC WITH DIFFERENTIAL/PLATELET
Basophils Absolute: 0 10*3/uL (ref 0.0–0.1)
Basophils Relative: 0 % (ref 0–1)
Eosinophils Absolute: 0.7 10*3/uL (ref 0.0–0.7)
Eosinophils Relative: 8 % — ABNORMAL HIGH (ref 0–5)
HCT: 38.4 % (ref 36.0–46.0)
Hemoglobin: 13.3 g/dL (ref 12.0–15.0)
MCH: 31 pg (ref 26.0–34.0)
MCHC: 34.6 g/dL (ref 30.0–36.0)
MCV: 89.5 fL (ref 78.0–100.0)
Monocytes Absolute: 0.7 10*3/uL (ref 0.1–1.0)
Monocytes Relative: 9 % (ref 3–12)
RDW: 12.6 % (ref 11.5–15.5)

## 2012-02-06 LAB — COMPREHENSIVE METABOLIC PANEL
AST: 25 U/L (ref 0–37)
Albumin: 3.6 g/dL (ref 3.5–5.2)
BUN: 9 mg/dL (ref 6–23)
Calcium: 9.5 mg/dL (ref 8.4–10.5)
Chloride: 106 mEq/L (ref 96–112)
Creatinine, Ser: 0.7 mg/dL (ref 0.50–1.10)
Total Bilirubin: 0.3 mg/dL (ref 0.3–1.2)

## 2012-02-06 LAB — LIPASE, BLOOD: Lipase: 51 U/L (ref 11–59)

## 2012-02-06 LAB — D-DIMER, QUANTITATIVE: D-Dimer, Quant: <0.27 ug/mL-FEU (ref 0.00–0.48)

## 2012-02-06 MED ORDER — ONDANSETRON HCL 4 MG/2ML IJ SOLN
4.0000 mg | Freq: Once | INTRAMUSCULAR | Status: AC
  Administered 2012-02-06: 4 mg via INTRAVENOUS
  Filled 2012-02-06: qty 2

## 2012-02-06 NOTE — ED Provider Notes (Addendum)
History     CSN: [id number]  Arrival date & time 02/06/12  0402   First MD Initiated Contact with Patient 02/06/12 0403      Chief Complaint  Patient presents with  . Abdominal Pain    (Consider location/radiation/quality/duration/timing/severity/associated sxs/prior treatment) HPI 41 year old female presents to emergency apartment complaining of right scapular pain radiating into her shoulder and arm along with right upper quadrant pain. She reports the pain started this evening around 9 PM after having a late dinner. She denies any shortness of breath. Pain is a dull neck being sensation. It does not worsen or improve with movement of the arm. Right upper quadrant pain is intermittent. She has had nausea and vomiting times one. Patient thought the pain may be due to gas, took gas pills, took a shower moved around but did not have improvement in pain. No similar history of similar pain. Patient is on Depo shot, but denies any prolonged immobilization, leg pain or calf swelling. No history of DVT or PE in herself or her family. She is not a smoker.  Chest and upper back pain is more concerning to her than her abdominal pain. Patient with transitory desat to 82% while is in the room, patient not complaining of shortness of breath Past Medical History  Diagnosis Date  . History of gestational diabetes   . Anxiety     tapered off sertraline, stable.  Marland Kitchen GERD (gastroesophageal reflux disease)   . Hay fever   . HTN (hypertension)   . HLD (hyperlipidemia)   . Accessory ureter   . Uterine polyp   . Obesity   . Vaginal cyst   . Abnormal Pap smear of cervix   . Fibroids 12/06/2011    Ultrasound showed: 8.8 x 4.6 x 5.6 cm uterus. 2.0 cm intramural fibroid in the uterine fundus, second intramural 1.7 cm fibroid in the right anterior upper corpus. EM 5 mm, no focal lesion. Normal ovaries.   Marland Kitchen Herpes labialis     acid foods and stress incite    Past Surgical History  Procedure Date  .  Tonsillectomy 1975  . Dilation and curettage of uterus 2007  . Wisdom tooth extraction      x4    Family History  Problem Relation Age of Onset  . Hypertension Mother   . Diabetes Mother   . Other Mother     NASH with varices  . Coronary artery disease Father 76    massive MI  . Cancer Father 64    lymphoma  . Hypertension Father   . Hyperlipidemia Father   . Diabetes Father   . Diabetes Brother   . Cancer Maternal Uncle 79    colon  . Stroke Maternal Grandmother     post card cath  . Cancer Maternal Grandmother     leukemia and polycythemia  . Hypertension Maternal Grandmother   . Hypertension Maternal Grandfather   . Stroke Paternal Grandmother   . Hypertension Paternal Grandmother   . Stroke Paternal Grandfather   . Hypertension Paternal Grandfather   . Liver disease Mother     NASH, ITP    History  Substance Use Topics  . Smoking status: Never Smoker   . Smokeless tobacco: Never Used  . Alcohol Use: Yes     Comment: 2 glasses red wine nightly    OB History    Grav Para Term Preterm Abortions TAB SAB Ect Mult Living   2 1  Review of Systems  See History of Present Illness; otherwise all other systems are reviewed and negative  Allergies  Latex and Aleve  Home Medications   Current Outpatient Rx  Name  Route  Sig  Dispense  Refill  . DIPHENHYDRAMINE HCL 25 MG PO CAPS   Oral   Take 12.5 mg by mouth every 6 (six) hours as needed. Itching         . LISINOPRIL-HYDROCHLOROTHIAZIDE 10-12.5 MG PO TABS   Oral   Take 1 tablet by mouth daily.   30 tablet   11   . OMEPRAZOLE 20 MG PO CPDR   Oral   Take 20 mg by mouth daily.           Marland Kitchen OVER THE COUNTER MEDICATION   Oral   Take 1 tablet by mouth daily.         Marland Kitchen VALACYCLOVIR HCL 1 G PO TABS                 BP 126/86  Pulse 88  Temp 98.4 F (36.9 C) (Oral)  Resp 20  SpO2 95%  LMP 02/06/2012  Physical Exam  Nursing note and vitals reviewed. Constitutional: She is  oriented to person, place, and time. She appears well-developed and well-nourished.       Uncomfortable appearing  HENT:  Head: Normocephalic and atraumatic.  Nose: Nose normal.  Mouth/Throat: Oropharynx is clear and moist.  Eyes: Conjunctivae normal and EOM are normal. Pupils are equal, round, and reactive to light.  Neck: Normal range of motion. Neck supple. No JVD present. No tracheal deviation present. No thyromegaly present.  Cardiovascular: Normal rate, regular rhythm, normal heart sounds and intact distal pulses.  Exam reveals no gallop and no friction rub.   No murmur heard. Pulmonary/Chest: Effort normal and breath sounds normal. No stridor. No respiratory distress. She has no wheezes. She has no rales. She exhibits no tenderness.  Abdominal: Soft. Bowel sounds are normal. She exhibits no distension and no mass. There is no tenderness (there is no tenderness to right upper quadrant no Murphy sign). There is no rebound and no guarding.  Musculoskeletal: Normal range of motion. She exhibits no edema and no tenderness.  Lymphadenopathy:    She has no cervical adenopathy.  Neurological: She is alert and oriented to person, place, and time. She exhibits normal muscle tone. Coordination normal.  Skin: Skin is warm and dry. No rash noted. No erythema. No pallor.  Psychiatric: She has a normal mood and affect. Her behavior is normal. Judgment and thought content normal.    ED Course  Procedures (including critical care time)  Labs Reviewed  CBC WITH DIFFERENTIAL - Abnormal; Notable for the following:    Eosinophils Relative 8 (*)     All other components within normal limits  COMPREHENSIVE METABOLIC PANEL - Abnormal; Notable for the following:    Glucose, Bld 134 (*)     All other components within normal limits  LIPASE, BLOOD  D-DIMER, QUANTITATIVE   Dg Chest 2 View  02/06/2012  *RADIOLOGY REPORT*  Clinical Data: Dull right shoulder blade and right upper quadrant abdominal pain.   CHEST - 2 VIEW  Comparison: None.  Findings: The lungs are well-aerated and clear.  There is no evidence of focal opacification, pleural effusion or pneumothorax.  The heart is normal in size; the mediastinal contour is within normal limits.  No acute osseous abnormalities are seen.  The right scapula is grossly unremarkable in appearance.  IMPRESSION: No  acute cardiopulmonary process seen.   Original Report Authenticated By: Tonia Ghent, M.D.    US Abdomen Complete  02/06/2012  *RADIOLOGY REPORT*  Clinical Data:  Abdominal pain and vomiting.  ABDOMINAL ULTRASOUND COMPLETE  Comparison:  None  Findings:  Gallbladder:  There is question of a small amount of sludge within the gallbladder.  The gallbladder is otherwise unremarkable in appearance.  No stones are identified.  No significant gallbladder wall thickening or pericholecystic fluid is seen.  No ultrasonographic Murphy's sign is elicited.  Common Bile Duct:  0.6 cm in diameter; within normal limits in caliber.  Liver:  Grossly normal parenchymal echogenicity and echotexture; no focal lesions identified.  Limited Doppler evaluation demonstrates normal blood flow within the liver.  IVC:  Unremarkable in appearance.  Pancreas:  Although the pancreas is difficult to visualize in its entirety due to overlying bowel gas, no focal pancreatic abnormality is identified.  Spleen:  9.2 cm in length; within normal limits in size and echotexture.  Right kidney:  9.7 cm in length; normal in size, configuration and parenchymal echogenicity.  No evidence of mass or hydronephrosis.  Left kidney:  12.2 cm in length; normal in size, configuration and parenchymal echogenicity.  No evidence of mass or hydronephrosis.  Abdominal Aorta:  Normal in caliber; no aneurysm identified.  Not fully characterized due to overlying bowel gas.  IMPRESSION: Question of a small amount of sludge within the gallbladder; gallbladder otherwise unremarkable in appearance.  No acute abnormalities  seen within the abdomen.   Original Report Authenticated By: Tonia Ghent, M.D.      Date: 02/06/2012  Rate: 81  Rhythm: normal sinus rhythm  QRS Axis: normal  Intervals: normal  ST/T Wave abnormalities: normal  Conduction Disutrbances:none  Narrative Interpretation:   Old EKG Reviewed: none available    No diagnosis found.    MDM  41 year old female with right shoulder pain radiating into her right arm along with right upper course her pain. She does not want pain medications at this time. With both a wound filled in unable to exacerbate pain with manipulation of the shoulder. Differential includes cholelithiasis, cholecystitis, PE, musculoskeletal pain, gastroenteritis, and others. EKG reassuring. Vitals are normal. Patient overall looks well though uncomfortable. We'll get chest x-ray, lab work, right upper quadrant ultrasound. Will consider d-dimer if ultrasound is negative.        Olivia Mackie, MD 02/06/12 0442  7:00 AM Ultrasound is unremarkable. Scans results with patient. D-dimer sent down though I feel this is less likely.  Olivia Mackie, MD 02/06/12 0701  D-dimer has come back normal. Patient will be discharged.  Dione Booze, MD 02/07/12 [id number]

## 2012-02-06 NOTE — ED Notes (Signed)
Pt remains in Radiology

## 2012-02-06 NOTE — ED Notes (Signed)
Pt returned from Radiology. Rates right upper abd pain at 2-3/10. States her right shoulder pain has now subsided. Denies need for Pain or nausea med

## 2012-02-06 NOTE — ED Notes (Signed)
Patient transported to Ultrasound 

## 2012-02-06 NOTE — ED Notes (Signed)
Pt amb to BR without problem.  MD in to speak with pt

## 2012-02-06 NOTE — ED Notes (Signed)
Resting quietly. Cont to rate pain at 5/10 in upper right abd. States the pain is deep and nagging

## 2012-02-06 NOTE — ED Notes (Signed)
Pt c/o aching in right shoulder blade through to chest. States pain is nagging. States she has vomited x 1.

## 2012-03-15 ENCOUNTER — Ambulatory Visit (INDEPENDENT_AMBULATORY_CARE_PROVIDER_SITE_OTHER): Payer: 59 | Admitting: *Deleted

## 2012-03-15 DIAGNOSIS — Z309 Encounter for contraceptive management, unspecified: Secondary | ICD-10-CM

## 2012-03-15 NOTE — Progress Notes (Signed)
Patient is taking Depo Provera and feels like she is gaining weight, she has used Nuva Ring in the past and would like to go back to this, 3 month samples given and patient will call for rx if she wants to continue with Nuva Ring.  She is a non smoker and has no history of blood clots.

## 2012-05-30 ENCOUNTER — Other Ambulatory Visit: Payer: Self-pay | Admitting: Family Medicine

## 2012-07-09 ENCOUNTER — Other Ambulatory Visit: Payer: 59

## 2012-08-21 ENCOUNTER — Encounter: Payer: Self-pay | Admitting: Obstetrics & Gynecology

## 2012-08-21 ENCOUNTER — Ambulatory Visit (INDEPENDENT_AMBULATORY_CARE_PROVIDER_SITE_OTHER): Payer: 59 | Admitting: Obstetrics & Gynecology

## 2012-08-21 VITALS — BP 109/81 | HR 75 | Ht 63.0 in | Wt 188.0 lb

## 2012-08-21 DIAGNOSIS — Z113 Encounter for screening for infections with a predominantly sexual mode of transmission: Secondary | ICD-10-CM

## 2012-08-21 DIAGNOSIS — Z0000 Encounter for general adult medical examination without abnormal findings: Secondary | ICD-10-CM

## 2012-08-21 DIAGNOSIS — Z124 Encounter for screening for malignant neoplasm of cervix: Secondary | ICD-10-CM

## 2012-08-21 DIAGNOSIS — Z1151 Encounter for screening for human papillomavirus (HPV): Secondary | ICD-10-CM

## 2012-08-21 DIAGNOSIS — Z01419 Encounter for gynecological examination (general) (routine) without abnormal findings: Secondary | ICD-10-CM

## 2012-08-21 NOTE — Progress Notes (Signed)
Subjective:    Colleen Boone is a 42 y.o. female who presents for an annual exam. The patient has no complaints today. She does need another mammogram ordered. She likes the Nuvaring, no BTB but her BP is increased today. She is already on an antihypertensive. She is considering permanent sterility for birth control.  The patient is sexually active. GYN screening history: last pap: was normal. The patient wears seatbelts: yes. The patient participates in regular exercise: yes. Has the patient ever been transfused or tattooed?: yes. (tattoo) The patient reports that there is not domestic violence in her life.   Menstrual History: OB History   Grav Para Term Preterm Abortions TAB SAB Ect Mult Living   2 1              Menarche age: 44  Patient's last menstrual period was 08/06/2012.    The following portions of the patient's history were reviewed and updated as appropriate: allergies, current medications, past family history, past medical history, past social history, past surgical history and problem list.  Review of Systems A comprehensive review of systems was negative. Monogamous for 18 months, denies dyspareunia. They do not want more kids. She is a Nutritional therapist at [employer]. Mammogram due.   Objective:    BP 109/81  Pulse 75  Ht 5\' 3"  (1.6 m)  Wt 188 lb (85.276 kg)  BMI 33.31 kg/m2  LMP 08/06/2012  General Appearance:    Alert, cooperative, no distress, appears stated age  Head:    Normocephalic, without obvious abnormality, atraumatic  Eyes:    PERRL, conjunctiva/corneas clear, EOM's intact, fundi    benign, both eyes  Ears:    Normal TM's and external ear canals, both ears  Nose:   Nares normal, septum midline, mucosa normal, no drainage    or sinus tenderness  Throat:   Lips, mucosa, and tongue normal; teeth and gums normal  Neck:   Supple, symmetrical, trachea midline, no adenopathy;    thyroid:  no enlargement/tenderness/nodules; no carotid   bruit or JVD  Back:      Symmetric, no curvature, ROM normal, no CVA tenderness  Lungs:     Clear to auscultation bilaterally, respirations unlabored  Chest Wall:    No tenderness or deformity   Heart:    Regular rate and rhythm, S1 and S2 normal, no murmur, rub   or gallop  Breast Exam:    No tenderness, masses, or nipple abnormality  Abdomen:     Soft, non-tender, bowel sounds active all four quadrants,    no masses, no organomegaly  Genitalia:    Normal female without lesion, discharge or tenderness, normal cervix, NSSA, NT, mobile, normal adnexal exam     Extremities:   Extremities normal, atraumatic, no cyanosis or edema  Pulses:   2+ and symmetric all extremities  Skin:   Skin color, texture, turgor normal, no rashes or lesions  Lymph nodes:   Cervical, supraclavicular, and axillary nodes normal  Neurologic:   CNII-XII intact, normal strength, sensation and reflexes    throughout  .    Assessment:    Healthy female exam.    Plan:     Mammogram. Thin prep Pap smear.  with HPV cotesting STI testing

## 2012-08-21 NOTE — Progress Notes (Signed)
Here today for gyn physical.  No other complaints.

## 2012-08-22 LAB — HIV ANTIBODY (ROUTINE TESTING W REFLEX): HIV: NONREACTIVE

## 2012-08-22 LAB — HEPATITIS B SURFACE ANTIGEN: Hepatitis B Surface Ag: NEGATIVE

## 2012-08-22 LAB — RPR

## 2012-08-28 ENCOUNTER — Telehealth: Payer: Self-pay | Admitting: *Deleted

## 2012-08-28 NOTE — Telephone Encounter (Signed)
Pt aware of results.  Pt has scheduled colposcopy.

## 2012-08-28 NOTE — Telephone Encounter (Signed)
Message copied by Grayland Ormond on Tue Aug 28, 2012  9:47 AM ------      Message from: Allie Bossier      Created: Tue Aug 28, 2012  9:38 AM       She will need a colposcopy scheduled. ------

## 2012-09-03 ENCOUNTER — Ambulatory Visit (HOSPITAL_COMMUNITY)
Admission: RE | Admit: 2012-09-03 | Discharge: 2012-09-03 | Disposition: A | Discharge status: Home / Self Care | Payer: 59 | Source: Ambulatory Visit | Attending: Obstetrics & Gynecology | Admitting: Obstetrics & Gynecology

## 2012-09-03 DIAGNOSIS — Z1231 Encounter for screening mammogram for malignant neoplasm of breast: Secondary | ICD-10-CM | POA: Insufficient documentation

## 2012-09-03 DIAGNOSIS — Z0000 Encounter for general adult medical examination without abnormal findings: Secondary | ICD-10-CM

## 2012-09-04 ENCOUNTER — Other Ambulatory Visit (HOSPITAL_COMMUNITY)
Admission: RE | Admit: 2012-09-04 | Discharge: 2012-09-04 | Disposition: A | Discharge status: Home / Self Care | Payer: 59 | Source: Ambulatory Visit | Attending: Obstetrics and Gynecology | Admitting: Obstetrics and Gynecology

## 2012-09-04 ENCOUNTER — Encounter: Payer: Self-pay | Admitting: Obstetrics and Gynecology

## 2012-09-04 ENCOUNTER — Ambulatory Visit (INDEPENDENT_AMBULATORY_CARE_PROVIDER_SITE_OTHER): Payer: 59 | Admitting: Obstetrics and Gynecology

## 2012-09-04 VITALS — BP 131/92 | HR 75 | Ht 63.0 in | Wt 189.0 lb

## 2012-09-04 DIAGNOSIS — R87810 Cervical high risk human papillomavirus (HPV) DNA test positive: Secondary | ICD-10-CM

## 2012-09-04 DIAGNOSIS — Z01812 Encounter for preprocedural laboratory examination: Secondary | ICD-10-CM

## 2012-09-04 DIAGNOSIS — R87610 Atypical squamous cells of undetermined significance on cytologic smear of cervix (ASC-US): Secondary | ICD-10-CM | POA: Insufficient documentation

## 2012-09-04 DIAGNOSIS — N870 Mild cervical dysplasia: Secondary | ICD-10-CM | POA: Insufficient documentation

## 2012-09-04 NOTE — Progress Notes (Signed)
Patient ID: Colleen Boone, female   DOB: [birth year], 42 y.o.   MRN: [id number]  42 yo with ASCUS +HPV on 08/21/2012 pap smear here for colposcopy Patient given informed consent, signed copy in the chart, time out was performed.  Placed in lithotomy position. Cervix viewed with speculum and colposcope after application of acetic acid.   Colposcopy adequate?  No TZ not visualized Acetowhite lesions?yes 2 o'clock Punctation?no Mosaicism?  no Abnormal vasculature?  no Biopsies?yes at 2 o'clock ECC?yes  COMMENTS: Patient was given post procedure instructions.  She will return in 2 weeks for results. Also discussed discontinuing her combined hormonal contraception and replacing it with a progesterone only (depo-provera, Mirena) or BTL in view of HTN and age 48. Patient agreed and will schedule an appointment for discussion of contraception options in the next few weeks

## 2012-09-04 NOTE — Patient Instructions (Signed)
Sterilization Information, Female Female sterilization is a procedure to permanently prevent pregnancy. There are different ways to perform sterilization, but all either block or close the fallopian tubes so that your eggs cannot reach your uterus. If your egg cannot reach your uterus, sperm cannot fertilize the egg, and you cannot get pregnant.  Sterilization is performed by a surgical procedure. Sometimes these procedures are performed in a hospital while a patient is asleep. Sometimes they can be done in a clinic setting with the patient awake. The fallopian tubes can be surgically cut, tied, or sealed through a procedure called tubal ligation. The fallopian tubes can also be closed with clips or rings. Sterilization can also be done by placing a tiny coil into each fallopian tube, which causes scar tissue to grow inside the tube. The scar tissue then blocks the tubes.  Discuss sterilization with your caregiver to answer any concerns you or your partner may have. You may want to ask what type of sterilization your caregiver performs. Some caregivers may not perform all the various options. Sterilization is permanent and should only be done if you are sure you do not want children or do not want any more children. Having a sterilization reversed may not be successful.  STERILIZATION PROCEDURES  Laparoscopic sterilization. This is a surgical method performed at a time other than right after childbirth. Two incisions are made in the lower abdomen. A thin, lighted tube (laparoscope) is inserted into one of the incisions and is used to perform the procedure. The fallopian tubes are closed with a ring or a clip. An instrument that uses heat could be used to seal the tubes closed (electrocautery).   Mini-laparotomy. This is a surgical method done 1 or 2 days after giving birth. Typically, a small incision is made just below the belly button (umbilicus) and the fallopian tubes are exposed. The tubes can then be  sealed, tied, or cut.   Hysteroscopic sterilization. This is performed at a time other than right after childbirth. A tiny, spring-like coil is inserted through the cervix and uterus and placed into the fallopian tubes. The coil causes scaring and blocks the tubes. Other forms of contraception should be used for 3 months after the procedure to allow the scar tissue to form completely. Additionally, it is required hysterosalpingography be done 3 months later to ensure that the procedure was successful. Hysterosalpingography is a procedure that uses X-rays to look at your uterus and fallopian tubes after a material to make them show up better has been inserted. IS STERILIZATION SAFE? Sterilization is considered safe with very rare complications. Risks depend on the type of procedure you have. As with any surgical procedure, there are risks. Some risks of sterilization by any means include:   Bleeding.  Infection.  Reaction to anesthesia medicine.  Injury to surrounding organs. Risks specific to having hysteroscopic coils placed include:  The coils may not be placed correctly the first time.   The coils may move out of place.   The tubes may not get completely blocked after 3 months.   Injury to surrounding organs when placing the coil.  HOW EFFECTIVE IS FEMALE STERILIZATION? Sterilization is nearly 100% effective, but it can fail. Depending on the type of sterilization, the rate of failure can be as high as 3%. After hysteroscopic sterilization with placement of fallopian tube coils, you will need back-up birth control for 3 months after the procedure. Sterilization is effective for a lifetime.  BENEFITS OF STERILIZATION  It does   not affect your hormones, and therefore will not affect your menstrual periods, sexual desire, or performance.   It is effective for a lifetime.   It is safe.   You do not need to worry about getting pregnant. Keep in mind that if you had the  hysteroscopic placement procedure, you must wait 3 months after the procedure (or until your caregiver confirms) before pregnancy is not considered possible.   There are no side effects unlike other types of birth control (contraception).  DRAWBACKS OF STERILIZATION  You must be sure you do not want children or any more children. The procedure is permanent.   It does not provide protection against sexually transmitted infections (STIs).   The tubes can grow back together. If this happens, there is a risk of pregnancy. There is also an increased risk (50%) of pregnancy being an ectopic pregnancy. This is a pregnancy that happens outside of the uterus. Document Released: 08/10/2007 Document Revised: 08/23/2011 Document Reviewed: 06/09/2011 [facility] Patient Information 2014 [place], [place]. Medroxyprogesterone injection [Contraceptive] What is this medicine? MEDROXYPROGESTERONE (me DROX ee proe JES te rone) contraceptive injections prevent pregnancy. They provide effective birth control for 3 months. Depo-subQ Provera 104 is also used for treating pain related to endometriosis. This medicine may be used for other purposes; ask your health care provider or pharmacist if you have questions. What should I tell my health care provider before I take this medicine? They need to know if you have any of these conditions: -frequently drink alcohol -asthma -blood vessel disease or a history of a blood clot in the lungs or legs -bone disease such as osteoporosis -breast cancer -diabetes -eating disorder (anorexia nervosa or bulimia) -high blood pressure -HIV infection or AIDS -kidney disease -liver disease -mental depression -migraine -seizures (convulsions) -stroke -tobacco smoker -vaginal bleeding -an unusual or allergic reaction to medroxyprogesterone, other hormones, medicines, foods, dyes, or preservatives -pregnant or trying to get pregnant -breast-feeding How should I use this  medicine? Depo-Provera Contraceptive injection is given into a muscle. Depo-subQ Provera 104 injection is given under the skin. These injections are given by a health care professional. You must not be pregnant before getting an injection. The injection is usually given during the first 5 days after the start of a menstrual period or 6 weeks after delivery of a baby. Talk to your pediatrician regarding the use of this medicine in children. Special care may be needed. These injections have been used in female children who have started having menstrual periods. Overdosage: If you think you have taken too much of this medicine contact a poison control center or emergency room at once. NOTE: This medicine is only for you. Do not share this medicine with others. What if I miss a dose? Try not to miss a dose. You must get an injection once every 3 months to maintain birth control. If you cannot keep an appointment, call and reschedule it. If you wait longer than 13 weeks between Depo-Provera contraceptive injections or longer than 14 weeks between Depo-subQ Provera 104 injections, you could get pregnant. Use another method for birth control if you miss your appointment. You may also need a pregnancy test before receiving another injection. What may interact with this medicine? Do not take this medicine with any of the following medications: -bosentan This medicine may also interact with the following medications: -aminoglutethimide -antibiotics or medicines for infections, especially rifampin, rifabutin, rifapentine, and griseofulvin -aprepitant -barbiturate medicines such as phenobarbital or primidone -bexarotene -carbamazepine -medicines for seizures like  ethotoin, felbamate, oxcarbazepine, phenytoin, topiramate -modafinil -St. John's wort This list may not describe all possible interactions. Give your health care provider a list of all the medicines, herbs, non-prescription drugs, or dietary  supplements you use. Also tell them if you smoke, drink alcohol, or use illegal drugs. Some items may interact with your medicine. What should I watch for while using this medicine? This drug does not protect you against HIV infection (AIDS) or other sexually transmitted diseases. Use of this product may cause you to lose calcium from your bones. Loss of calcium may cause weak bones (osteoporosis). Only use this product for more than 2 years if other forms of birth control are not right for you. The longer you use this product for birth control the more likely you will be at risk for weak bones. Ask your health care professional how you can keep strong bones. You may have a change in bleeding pattern or irregular periods. Many females stop having periods while taking this drug. If you have received your injections on time, your chance of being pregnant is very low. If you think you may be pregnant, see your health care professional as soon as possible. Tell your health care professional if you want to get pregnant within the next year. The effect of this medicine may last a long time after you get your last injection. What side effects may I notice from receiving this medicine? Side effects that you should report to your doctor or health care professional as soon as possible: -allergic reactions like skin rash, itching or hives, swelling of the face, lips, or tongue -breast tenderness or discharge -breathing problems -changes in vision -depression -feeling faint or lightheaded, falls -fever -pain in the abdomen, chest, groin, or leg -problems with balance, talking, walking -unusually weak or tired -yellowing of the eyes or skin Side effects that usually do not require medical attention (report to your doctor or health care professional if they continue or are bothersome): -acne -fluid retention and swelling -headache -irregular periods, spotting, or absent periods -temporary pain, itching,  or skin reaction at site where injected -weight gain This list may not describe all possible side effects. Call your doctor for medical advice about side effects. You may report side effects to FDA at 1-800-FDA-1088. Where should I keep my medicine? This does not apply. The injection will be given to you by a health care professional. NOTE: This sheet is a summary. It may not cover all possible information. If you have questions about this medicine, talk to your doctor, pharmacist, or health care provider.  2012, Elsevier/Gold Standard. (03/14/2008 6:37:56 PM)  Levonorgestrel intrauterine device (IUD) What is this medicine? LEVONORGESTREL IUD (LEE voe nor jes trel) is a contraceptive (birth control) device. It is used to prevent pregnancy and to treat heavy bleeding that occurs during your period. It can be used for up to 5 years. This medicine may be used for other purposes; ask your health care provider or pharmacist if you have questions. What should I tell my health care provider before I take this medicine? They need to know if you have any of these conditions: -abnormal Pap smear -cancer of the breast, uterus, or cervix -diabetes -endometritis -genital or pelvic infection now or in the past -have more than one sexual partner or your partner has more than one partner -heart disease -history of an ectopic or tubal pregnancy -immune system problems -IUD in place -liver disease or tumor -problems with blood clots or take blood-thinners -  use intravenous drugs -uterus of unusual shape -vaginal bleeding that has not been explained -an unusual or allergic reaction to levonorgestrel, other hormones, silicone, or polyethylene, medicines, foods, dyes, or preservatives -pregnant or trying to get pregnant -breast-feeding How should I use this medicine? This device is placed inside the uterus by a health care professional. Talk to your pediatrician regarding the use of this medicine in  children. Special care may be needed. Overdosage: If you think you have taken too much of this medicine contact a poison control center or emergency room at once. NOTE: This medicine is only for you. Do not share this medicine with others. What if I miss a dose? This does not apply. What may interact with this medicine? Do not take this medicine with any of the following medications: -amprenavir -bosentan -fosamprenavir This medicine may also interact with the following medications: -aprepitant -barbiturate medicines for inducing sleep or treating seizures -bexarotene -griseofulvin -medicines to treat seizures like carbamazepine, ethotoin, felbamate, oxcarbazepine, phenytoin, topiramate -modafinil -pioglitazone -rifabutin -rifampin -rifapentine -some medicines to treat HIV infection like atazanavir, indinavir, lopinavir, nelfinavir, tipranavir, ritonavir -St. John's wort -warfarin This list may not describe all possible interactions. Give your health care provider a list of all the medicines, herbs, non-prescription drugs, or dietary supplements you use. Also tell them if you smoke, drink alcohol, or use illegal drugs. Some items may interact with your medicine. What should I watch for while using this medicine? Visit your doctor or health care professional for regular check ups. See your doctor if you or your partner has sexual contact with others, becomes HIV positive, or gets a sexual transmitted disease. This product does not protect you against HIV infection (AIDS) or other sexually transmitted diseases. You can check the placement of the IUD yourself by reaching up to the top of your vagina with clean fingers to feel the threads. Do not pull on the threads. It is a good habit to check placement after each menstrual period. Call your doctor right away if you feel more of the IUD than just the threads or if you cannot feel the threads at all. The IUD may come out by itself. You may  become pregnant if the device comes out. If you notice that the IUD has come out use a backup birth control method like condoms and call your health care provider. Using tampons will not change the position of the IUD and are okay to use during your period. What side effects may I notice from receiving this medicine? Side effects that you should report to your doctor or health care professional as soon as possible: -allergic reactions like skin rash, itching or hives, swelling of the face, lips, or tongue -fever, flu-like symptoms -genital sores -high blood pressure -no menstrual period for 6 weeks during use -pain, swelling, warmth in the leg -pelvic pain or tenderness -severe or sudden headache -signs of pregnancy -stomach cramping -sudden shortness of breath -trouble with balance, talking, or walking -unusual vaginal bleeding, discharge -yellowing of the eyes or skin Side effects that usually do not require medical attention (report to your doctor or health care professional if they continue or are bothersome): -acne -breast pain -change in sex drive or performance -changes in weight -cramping, dizziness, or faintness while the device is being inserted -headache -irregular menstrual bleeding within first 3 to 6 months of use -nausea This list may not describe all possible side effects. Call your doctor for medical advice about side effects. You may report side effects  to FDA at 1-800-FDA-1088. Where should I keep my medicine? This does not apply. NOTE: This sheet is a summary. It may not cover all possible information. If you have questions about this medicine, talk to your doctor, pharmacist, or health care provider.  2012, [employer]vier/Gold Standard. (03/14/2008 6:39:08 PM)

## 2012-09-05 ENCOUNTER — Encounter: Payer: Self-pay | Admitting: Obstetrics and Gynecology

## 2012-09-05 ENCOUNTER — Telehealth: Payer: Self-pay | Admitting: *Deleted

## 2012-09-05 NOTE — Telephone Encounter (Signed)
Message copied by Grayland Ormond on Wed Sep 05, 2012  2:39 PM ------      Message from: CONSTANT, PEGGY      Created: Wed Sep 05, 2012 12:49 PM       Please inform patient that colposcopy result are consistent with pap smear results- Please follow up in 6 months for repeat pap smear.      Patient also needs to come in sooner to change contraception secondary to her HTN.            Thanks            Clinical cytogeneticist ------

## 2012-09-05 NOTE — Telephone Encounter (Signed)
Left message for pt to call back  °

## 2012-09-05 NOTE — Telephone Encounter (Signed)
Message copied by Grayland Ormond on Wed Sep 05, 2012  4:13 PM ------      Message from: CONSTANT, PEGGY      Created: Wed Sep 05, 2012 12:49 PM       Please inform patient that colposcopy result are consistent with pap smear results- Please follow up in 6 months for repeat pap smear.      Patient also needs to come in sooner to change contraception secondary to her HTN.            Thanks            Clinical cytogeneticist ------

## 2012-09-05 NOTE — Telephone Encounter (Signed)
Pt aware of results 

## 2012-09-25 ENCOUNTER — Ambulatory Visit (INDEPENDENT_AMBULATORY_CARE_PROVIDER_SITE_OTHER): Payer: 59 | Admitting: *Deleted

## 2012-09-25 DIAGNOSIS — Z3049 Encounter for surveillance of other contraceptives: Secondary | ICD-10-CM

## 2012-09-25 DIAGNOSIS — Z3042 Encounter for surveillance of injectable contraceptive: Secondary | ICD-10-CM

## 2012-09-25 MED ORDER — MEDROXYPROGESTERONE ACETATE 150 MG/ML IM SUSP
150.0000 mg | Freq: Once | INTRAMUSCULAR | Status: AC
  Administered 2012-09-25: 150 mg via INTRAMUSCULAR

## 2012-09-25 MED ORDER — MEDROXYPROGESTERONE ACETATE 150 MG/ML IM SUSP: 150.0000 mg | INTRAMUSCULAR | Status: DC

## 2012-10-08 ENCOUNTER — Encounter (HOSPITAL_COMMUNITY): Payer: Self-pay | Admitting: Emergency Medicine

## 2012-10-08 ENCOUNTER — Emergency Department (HOSPITAL_COMMUNITY)
Admission: EM | Admit: 2012-10-08 | Discharge: 2012-10-08 | Disposition: A | Discharge status: Home / Self Care | Payer: 59 | Source: Home / Self Care

## 2012-10-08 DIAGNOSIS — J209 Acute bronchitis, unspecified: Secondary | ICD-10-CM

## 2012-10-08 MED ORDER — ALBUTEROL SULFATE HFA 108 (90 BASE) MCG/ACT IN AERS: 2.0000 | INHALATION_SPRAY | RESPIRATORY_TRACT | Status: DC | PRN

## 2012-10-08 MED ORDER — HYDROCOD POLST-CHLORPHEN POLST 10-8 MG/5ML PO LQCR: 5.0000 mL | Freq: Two times a day (BID) | ORAL | Status: DC | PRN

## 2012-10-08 NOTE — ED Provider Notes (Signed)
CSN: [id number]     Arrival date & time 10/08/12  1107 History     First MD Initiated Contact with Patient 10/08/12 1124     Chief Complaint  Patient presents with  . Cough    nonproductive x 5 days.    (Consider location/radiation/quality/duration/timing/severity/associated sxs/prior Treatment) HPI Comments: 42 year old female presents complaining of one week of dry, nonproductive cough. 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 In addition to the cough, she also admits to a burning sensation in her nose that is very slight, the worst is the cough. She took [employer] for the cough which did not help. She is also taking DayQuil and NyQuil which has helped. She denies any fever, chest pain, shortness of breath, NVD, or history of pneumonia.  Patient is a 42 y.o. female presenting with cough.  Cough Associated symptoms: rhinorrhea   Associated symptoms: no chest pain, no chills, no ear pain, no fever, no myalgias, no rash, no shortness of breath and no sore throat     Past Medical History  Diagnosis Date  . History of gestational diabetes   . Anxiety     tapered off sertraline, stable.  Marland Kitchen GERD (gastroesophageal reflux disease)   . Hay fever   . HTN (hypertension)   . HLD (hyperlipidemia)   . Accessory ureter   . Uterine polyp   . Obesity   . Vaginal cyst   . Abnormal Pap smear of cervix   . Fibroids 12/06/2011    Ultrasound showed: 8.8 x 4.6 x 5.6 cm uterus. 2.0 cm intramural fibroid in the uterine fundus, second intramural 1.7 cm fibroid in the right anterior upper corpus. EM 5 mm, no focal lesion. Normal ovaries.   Marland Kitchen Herpes labialis     acid foods and stress incite   Past Surgical History  Procedure Laterality Date  . Tonsillectomy  1975  . Dilation and curettage of uterus  2007  . Wisdom tooth extraction       x4   Family History  Problem Relation Age of Onset  . Hypertension Mother   .  Diabetes Mother   . Other Mother     NASH with varices  . Coronary artery disease Father 61    massive MI  . Cancer Father 67    lymphoma  . Hypertension Father   . Hyperlipidemia Father   . Diabetes Father   . Diabetes Brother   . Cancer Maternal Uncle 69    colon  . Stroke Maternal Grandmother     post card cath  . Cancer Maternal Grandmother     leukemia and polycythemia  . Hypertension Maternal Grandmother   . Hypertension Maternal Grandfather   . Stroke Paternal Grandmother   . Hypertension Paternal Grandmother   . Stroke Paternal Grandfather   . Hypertension Paternal Grandfather   . Liver disease Mother     NASH, ITP   History  Substance Use Topics  . Smoking status: Never Smoker   . Smokeless tobacco: Never Used  . Alcohol Use: Yes     Comment: 2 glasses red wine nightly   OB History   Grav Para Term Preterm Abortions TAB SAB Ect Mult Living   2 1             Review of Systems  Constitutional: Negative for fever and chills.  HENT: Positive for congestion and rhinorrhea. Negative for ear pain, sore throat, sneezing and  postnasal drip.   Eyes: Negative for visual disturbance.  Respiratory: Positive for cough. Negative for shortness of breath.   Cardiovascular: Negative for chest pain, palpitations and leg swelling.  Gastrointestinal: Negative for nausea, vomiting and abdominal pain.  Endocrine: Negative for polydipsia and polyuria.  Genitourinary: Negative for dysuria, urgency and frequency.  Musculoskeletal: Negative for myalgias and arthralgias.  Skin: Negative for rash.  Neurological: Negative for dizziness, weakness and light-headedness.    Allergies  Latex and Aleve  Home Medications   Current Outpatient Rx  Name  Route  Sig  Dispense  Refill  . lisinopril-hydrochlorothiazide (PRINZIDE,ZESTORETIC) 10-12.5 MG per tablet   Oral   Take 1 tablet by mouth daily.   30 tablet   11   . omeprazole (PRILOSEC) 20 MG capsule   Oral   Take 20 mg by  mouth daily.           . valACYclovir (VALTREX) 1000 MG tablet               . albuterol (PROVENTIL HFA;VENTOLIN HFA) 108 (90 BASE) MCG/ACT inhaler   Inhalation   Inhale 2 puffs into the lungs every 4 (four) hours as needed.   1 Inhaler   1   . chlorpheniramine-HYDROcodone (TUSSIONEX PENNKINETIC ER) 10-8 MG/5ML LQCR   Oral   Take 5 mLs by mouth every 12 (twelve) hours as needed.   140 mL   0   . diphenhydrAMINE (BENADRYL) 25 mg capsule   Oral   Take 12.5 mg by mouth every 6 (six) hours as needed. Itching         . etonogestrel-ethinyl estradiol (NUVARING) 0.12-0.015 MG/24HR vaginal ring   Vaginal   Place 1 each vaginally every 28 (twenty-eight) days. Insert vaginally and leave in place for 3 consecutive weeks, then remove for 1 week.         . medroxyPROGESTERone (DEPO-PROVERA) 150 MG/ML injection   Intramuscular   Inject 1 mL (150 mg total) into the muscle every 3 (three) months.   1 mL   6    BP 124/85  Pulse 80  Temp(Src) 98.5 F (36.9 C) (Oral)  Resp 16  SpO2 100% Physical Exam  Nursing note and vitals reviewed. Constitutional: She is oriented to person, place, and time. Vital signs are normal. She appears well-developed and well-nourished. No distress.  HENT:  Head: Atraumatic.  Eyes: EOM are normal. Pupils are equal, round, and reactive to light.  Neck: Normal range of motion. Neck supple.  Cardiovascular: Normal rate, regular rhythm and normal heart sounds.  Exam reveals no gallop and no friction rub.   No murmur heard. Pulmonary/Chest: Effort normal and breath sounds normal. No respiratory distress. She has no wheezes. She has no rales.  Lymphadenopathy:    She has no cervical adenopathy.  Neurological: She is alert and oriented to person, place, and time. She has normal strength.  Skin: Skin is warm and dry. She is not diaphoretic.  Psychiatric: She has a normal mood and affect. Her behavior is normal. Judgment normal.    ED Course    Procedures (including critical care time)  Labs Reviewed - No data to display No results found. 1. Bronchitis, acute     MDM  Simple acute bronchitis, no indication that this is pneumonia. Given the option of ruling this out with a chest x-ray versus treating symptomatically and returning if she worsens, she elects to treat symptomatically. She may continue the DayQuil and NyQuil, also Tussionex as needed and albuterol inhaler  as needed for cough. She will followup if she starts to develop fever, shortness of breath, or worsening of her symptoms.   Meds ordered this encounter  Medications  . chlorpheniramine-HYDROcodone (TUSSIONEX PENNKINETIC ER) 10-8 MG/5ML LQCR    Sig: Take 5 mLs by mouth every 12 (twelve) hours as needed.    Dispense:  140 mL    Refill:  0  . albuterol (PROVENTIL HFA;VENTOLIN HFA) 108 (90 BASE) MCG/ACT inhaler    Sig: Inhale 2 puffs into the lungs every 4 (four) hours as needed.    Dispense:  1 Inhaler    Refill:  1     Graylon Good, PA-C 10/08/12 1201

## 2012-10-08 NOTE — ED Provider Notes (Signed)
Medical screening examination/treatment/procedure(s) were performed by a resident physician or non-physician practitioner and as the supervising physician I was immediately available for consultation/collaboration.  Lynk Marti, MD   Reeta Kuk S Aland Chestnutt, MD 10/08/12 1313 

## 2012-10-08 NOTE — ED Notes (Signed)
Pt c/o a cough that is nonproductive. Cough is persistent.   Symptoms present x 5 days. Pt has tried tessalon and otc meds with no relief in symptoms.  Pt denies fever, sob and chest pain.

## 2013-01-10 ENCOUNTER — Other Ambulatory Visit: Payer: Self-pay

## 2013-01-26 ENCOUNTER — Other Ambulatory Visit: Payer: Self-pay | Admitting: Family Medicine

## 2013-05-24 ENCOUNTER — Other Ambulatory Visit: Payer: Self-pay | Admitting: Family Medicine

## 2013-05-30 ENCOUNTER — Other Ambulatory Visit: Payer: Self-pay | Admitting: Family Medicine

## 2013-06-21 ENCOUNTER — Ambulatory Visit (INDEPENDENT_AMBULATORY_CARE_PROVIDER_SITE_OTHER): Payer: 59 | Admitting: Obstetrics & Gynecology

## 2013-06-21 ENCOUNTER — Encounter: Payer: Self-pay | Admitting: Obstetrics & Gynecology

## 2013-06-21 VITALS — BP 116/86 | HR 84 | Ht 63.0 in | Wt 191.0 lb

## 2013-06-21 DIAGNOSIS — N9089 Other specified noninflammatory disorders of vulva and perineum: Secondary | ICD-10-CM

## 2013-06-21 MED ORDER — VALACYCLOVIR HCL 1 G PO TABS: 1000.0000 mg | ORAL_TABLET | Freq: Every day | ORAL | Status: DC

## 2013-06-21 NOTE — Progress Notes (Signed)
   Subjective:    Patient ID: Colleen Boone, female    DOB: 01-30-1971, 43 y.o.   MRN: [id number]  HPI  43 yo SW (monogamous for 2+years) here today with the complaint of right labial lesion for about the last 4 days. She is being treated with Valtrex prn for fever blister.   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 She is already taking Valtrex on a prn basis. She has decided that she will take Valtrex daily. Information about HSV given.

## 2013-07-30 ENCOUNTER — Ambulatory Visit: Payer: Self-pay | Admitting: Family Medicine

## 2013-09-25 ENCOUNTER — Other Ambulatory Visit: Payer: Self-pay | Admitting: Family Medicine

## 2013-10-03 ENCOUNTER — Encounter: Payer: Self-pay | Admitting: Family Medicine

## 2013-10-03 ENCOUNTER — Ambulatory Visit (INDEPENDENT_AMBULATORY_CARE_PROVIDER_SITE_OTHER): Payer: 59 | Admitting: Family Medicine

## 2013-10-03 VITALS — BP 116/84 | HR 84 | Temp 98.5°F | Ht 63.0 in | Wt 196.5 lb

## 2013-10-03 DIAGNOSIS — K219 Gastro-esophageal reflux disease without esophagitis: Secondary | ICD-10-CM

## 2013-10-03 DIAGNOSIS — R739 Hyperglycemia, unspecified: Secondary | ICD-10-CM

## 2013-10-03 DIAGNOSIS — R7309 Other abnormal glucose: Secondary | ICD-10-CM

## 2013-10-03 DIAGNOSIS — E669 Obesity, unspecified: Secondary | ICD-10-CM

## 2013-10-03 DIAGNOSIS — R05 Cough: Secondary | ICD-10-CM

## 2013-10-03 DIAGNOSIS — R058 Other specified cough: Secondary | ICD-10-CM | POA: Insufficient documentation

## 2013-10-03 DIAGNOSIS — I10 Essential (primary) hypertension: Secondary | ICD-10-CM

## 2013-10-03 DIAGNOSIS — R059 Cough, unspecified: Secondary | ICD-10-CM

## 2013-10-03 DIAGNOSIS — E785 Hyperlipidemia, unspecified: Secondary | ICD-10-CM

## 2013-10-03 MED ORDER — LISINOPRIL-HYDROCHLOROTHIAZIDE 10-12.5 MG PO TABS: ORAL_TABLET | ORAL | Status: DC

## 2013-10-03 NOTE — Progress Notes (Signed)
Pre visit review using our clinic review tool, if applicable. No additional management support is needed unless otherwise documented below in the visit note. 

## 2013-10-03 NOTE — Patient Instructions (Signed)
Good to see you today, call us with questions. Blood work today - if cholesterol elevated we may start low strength statin. I think you have post viral cough - lungs are sounding good today. I've refilled your medicine today - watch dry cough.

## 2013-10-03 NOTE — Assessment & Plan Note (Signed)
Pt will continue to work on diet and lifestyle.

## 2013-10-03 NOTE — Assessment & Plan Note (Signed)
Check A1c today.

## 2013-10-03 NOTE — Assessment & Plan Note (Signed)
Discussed supportive care. Pt declines cough syrup today.

## 2013-10-03 NOTE — Progress Notes (Signed)
BP 116/84  Pulse 84  Temp(Src) 98.5 F (36.9 C) (Oral)  Ht 5\' 3"  (1.6 m)  Wt 196 lb 8 oz (89.132 kg)  BMI 34.82 kg/m2   CC: med refill visit  Subjective:    Patient ID: Colleen Boone, female    DOB: [birth year], 43 y.o.   MRN: [id number]  HPI: Colleen Boone is a 43 y.o. female presenting on 10/03/2013 for Follow-up   Not seen since 01/2012. Requests labwork today - as well as med refill visit.  HTN - Compliant with current antihypertensive regimen of lisinopril hctz 10/12.5.  Does not check blood pressures at home.  No low blood pressure symptoms of dizziness/syncope.  Denies HA, vision changes, CP/tightness.   Some recent dyspnea as well as leg swelling attributed to recent bronchitis treated at ER - zpack and albuterol inhaler. H/o reactive airways with bronchitis in past. Productive cough present for last 2 wks. No fevers/chills.  GERD - nexium 20mg  daily. No breakthrough sxs.  HLD - did not tolerate simvastatin  Wt Readings from Last 3 Encounters:  10/03/13 196 lb 8 oz (89.132 kg)  06/21/13 191 lb (86.637 kg)  09/04/12 189 lb (85.73 kg)  Body mass index is 34.82 kg/(m^2).   Relevant past medical, surgical, family and social history reviewed and updated as indicated.  Allergies and medications reviewed and updated. Current Outpatient Prescriptions on File Prior to Visit  Medication Sig  . esomeprazole (NEXIUM) 20 MG capsule Take 20 mg by mouth daily at 12 noon.  . medroxyPROGESTERone (DEPO-PROVERA) 150 MG/ML injection Inject 1 mL (150 mg total) into the muscle every 3 (three) months.  . valACYclovir (VALTREX) 1000 MG tablet Take 1 tablet (1,000 mg total) by mouth daily.   No current facility-administered medications on file prior to visit.    Review of Systems Per HPI unless specifically indicated above    Objective:    BP 116/84  Pulse 84  Temp(Src) 98.5 F (36.9 C) (Oral)  Ht 5\' 3"  (1.6 m)  Wt 196 lb 8 oz (89.132 kg)  BMI 34.82 kg/m2  Physical Exam    Nursing note and vitals reviewed. Constitutional: She appears well-developed and well-nourished. No distress.  HENT:  Mouth/Throat: Oropharynx is clear and moist. No oropharyngeal exudate.  Cardiovascular: Normal rate, regular rhythm, normal heart sounds and intact distal pulses.   No murmur heard. Pulmonary/Chest: Effort normal and breath sounds normal. No respiratory distress. She has no wheezes. She has no rales.  Lungs clear but hoarse cough present  Musculoskeletal: She exhibits edema (mild nonpitting edema).  Psychiatric: She has a normal mood and affect.       Assessment & Plan:   Problem List Items Addressed This Visit   Post-viral cough syndrome     Discussed supportive care. Pt declines cough syrup today.    Obesity     Pt will continue to work on diet and lifestyle.    Hyperglycemia     Check A1c today.    Relevant Orders      Hemoglobin A1c      Basic metabolic panel   HTN (hypertension) - Primary     Chronic, stable. Continue med. Monitor for ACEI - induced cough. Consider ARB if bothersome.    Relevant Medications      lisinopril-hydrochlorothiazide (PRINZIDE,ZESTORETIC) 10-12.5 MG per tablet   Other Relevant Orders      TSH   HLD (hyperlipidemia)     Check FLP today as pt fasting today. Pt has not tolerated  lipitor or simvastatin in past. Reviewed fmhx with patient - if persistently elevated LDL discussed trial of low potency statin and pt agrees.    Relevant Medications      lisinopril-hydrochlorothiazide (PRINZIDE,ZESTORETIC) 10-12.5 MG per tablet   Other Relevant Orders      Lipid panel      TSH   GERD (gastroesophageal reflux disease)     Continue nexium OTC 20mg  daily.        Follow up plan: Return in about 1 year (around 10/04/2014), or as needed, for follow up visit.

## 2013-10-03 NOTE — Assessment & Plan Note (Signed)
Chronic, stable. Continue med. Monitor for ACEI - induced cough. Consider ARB if bothersome.

## 2013-10-03 NOTE — Assessment & Plan Note (Signed)
Check FLP today as pt fasting today. Pt has not tolerated lipitor or simvastatin in past. Reviewed fmhx with patient - if persistently elevated LDL discussed trial of low potency statin and pt agrees.

## 2013-10-03 NOTE — Assessment & Plan Note (Signed)
Continue nexium OTC 20mg  daily.

## 2013-10-04 LAB — LIPID PANEL
CHOL/HDL RATIO: 7
CHOLESTEROL: 247 mg/dL — AB (ref 0–200)
HDL: 36.8 mg/dL — ABNORMAL LOW (ref >39.00)
LDL Cholesterol: 185 mg/dL — ABNORMAL HIGH (ref 0–99)
NonHDL: 210.2
TRIGLYCERIDES: 125 mg/dL (ref 0.0–149.0)
VLDL: 25 mg/dL (ref 0.0–40.0)

## 2013-10-04 LAB — BASIC METABOLIC PANEL
BUN: 10 mg/dL (ref 6–23)
CALCIUM: 9.8 mg/dL (ref 8.4–10.5)
CO2: 26 meq/L (ref 19–32)
Chloride: 105 mEq/L (ref 96–112)
Creatinine, Ser: 0.8 mg/dL (ref 0.4–1.2)
GFR: 79.53 mL/min (ref >60.00)
Glucose, Bld: 92 mg/dL (ref 70–99)
Potassium: 4.1 mEq/L (ref 3.5–5.1)
SODIUM: 138 meq/L (ref 135–145)

## 2013-10-04 LAB — HEMOGLOBIN A1C: HEMOGLOBIN A1C: 5.9 % (ref 4.6–6.5)

## 2013-10-04 LAB — TSH: TSH: 1.26 u[IU]/mL (ref 0.35–4.50)

## 2013-10-05 ENCOUNTER — Other Ambulatory Visit: Payer: Self-pay | Admitting: Family Medicine

## 2013-10-05 MED ORDER — PRAVASTATIN SODIUM 40 MG PO TABS: 40.0000 mg | ORAL_TABLET | Freq: Every day | ORAL | Status: DC

## 2013-11-25 ENCOUNTER — Encounter: Payer: Self-pay | Admitting: Family Medicine

## 2013-11-25 ENCOUNTER — Ambulatory Visit (INDEPENDENT_AMBULATORY_CARE_PROVIDER_SITE_OTHER): Payer: 59 | Admitting: Family Medicine

## 2013-11-25 VITALS — BP 134/82 | HR 84 | Temp 98.9°F | Ht 63.0 in | Wt 199.0 lb

## 2013-11-25 DIAGNOSIS — J209 Acute bronchitis, unspecified: Secondary | ICD-10-CM

## 2013-11-25 MED ORDER — BENZONATATE 200 MG PO CAPS: 200.0000 mg | ORAL_CAPSULE | Freq: Three times a day (TID) | ORAL | Status: DC | PRN

## 2013-11-25 MED ORDER — GUAIFENESIN-CODEINE 100-10 MG/5ML PO SYRP: 5.0000 mL | ORAL_SOLUTION | Freq: Four times a day (QID) | ORAL | Status: DC | PRN

## 2013-11-25 MED ORDER — AZITHROMYCIN 250 MG PO TABS: ORAL_TABLET | ORAL | Status: DC

## 2013-11-25 NOTE — Progress Notes (Signed)
Subjective:    Patient ID: Colleen Boone, female    DOB: 1971-01-31, 43 y.o.   MRN: [id number]  HPI Here with cough  [place] for 2 weeks -started as nasal symptoms  Yellow nasal d/c  Now having more cough -- rattling - does not get mucous all the way out  Not much wheezing  Really tired - feels lousy  Feels a bit warm - 99.5- low grade temp   Colleen Boone works in ER   Patient Active Problem List   Diagnosis Date Noted  . Post-viral cough syndrome 10/03/2013  . ASCUS with positive high risk HPV cervical 09/04/2012  . Hyperglycemia 01/09/2012  . Healthcare maintenance 01/04/2012  . Fibroids 12/06/2011  . Breakthrough bleeding on depo provera 11/29/2011  . HTN (hypertension)   . HLD (hyperlipidemia)   . Obesity   . GERD (gastroesophageal reflux disease)   . Family history of diabetes mellitus   . Family history of early CAD    Past Medical History  Diagnosis Date  . History of gestational diabetes   . Anxiety     tapered off sertraline, stable.  Marland Kitchen GERD (gastroesophageal reflux disease)   . Hay fever   . HTN (hypertension)   . HLD (hyperlipidemia)   . Accessory ureter   . Uterine polyp   . Obesity   . Vaginal cyst   . Abnormal Pap smear of cervix   . Fibroids 12/06/2011    Ultrasound showed: 8.8 x 4.6 x 5.6 cm uterus. 2.0 cm intramural fibroid in the uterine fundus, second intramural 1.7 cm fibroid in the right anterior upper corpus. EM 5 mm, no focal lesion. Normal ovaries.   Marland Kitchen Herpes labialis     acid foods and stress incite   Past Surgical History  Procedure Laterality Date  . Tonsillectomy  1975  . Dilation and curettage of uterus  2007  . Wisdom tooth extraction       x4   History  Substance Use Topics  . Smoking status: Never Smoker   . Smokeless tobacco: Never Used  . Alcohol Use: Yes     Comment: 2 glasses red wine nightly   Family History  Problem Relation Age of Onset  . Hypertension Mother   . Diabetes Mother   . Other Mother     NASH with varices    . Coronary artery disease Father 74    massive MI  . Cancer Father 48    lymphoma  . Hypertension Father   . Hyperlipidemia Father   . Diabetes Father   . Diabetes Brother   . Cancer Maternal Uncle 69    colon  . Stroke Maternal Grandmother     post card cath  . Cancer Maternal Grandmother     leukemia and polycythemia  . Hypertension Maternal Grandmother   . Hypertension Maternal Grandfather   . Stroke Paternal Grandmother   . Hypertension Paternal Grandmother   . Stroke Paternal Grandfather   . Hypertension Paternal Grandfather   . Liver disease Mother     NASH, ITP  . Cancer Maternal Uncle     colon   Allergies  Allergen Reactions  . Latex Rash  . Aleve [Naproxen Sodium]     Hot flashes  . Simvastatin Other (See Comments)    fatigue   Current Outpatient Prescriptions on File Prior to Visit  Medication Sig Dispense Refill  . esomeprazole (NEXIUM) 20 MG capsule Take 20 mg by mouth daily at 12 noon.      Marland Kitchen  lisinopril-hydrochlorothiazide (PRINZIDE,ZESTORETIC) 10-12.5 MG per tablet TAKE 1 TABLET EVERY DAY  90 tablet  3  . valACYclovir (VALTREX) 1000 MG tablet Take 1 tablet (1,000 mg total) by mouth daily.  31 tablet  12  . medroxyPROGESTERone (DEPO-PROVERA) 150 MG/ML injection Inject 1 mL (150 mg total) into the muscle every 3 (three) months.  1 mL  6  . pravastatin (PRAVACHOL) 40 MG tablet Take 1 tablet (40 mg total) by mouth daily.  90 tablet  3   No current facility-administered medications on file prior to visit.      Review of Systems Review of Systems  Constitutional: Negative for  appetite change, and unexpected weight change. pos for fatigue/malaise ENT pos for congestion and neg for sinus pain  Eyes: Negative for pain and visual disturbance.  Respiratory: Negative for wheeze  and shortness of breath.   Cardiovascular: Negative for cp or palpitations    Gastrointestinal: Negative for nausea, diarrhea and constipation.  Genitourinary: Negative for urgency  and frequency.  Skin: Negative for pallor or rash   Neurological: Negative for weakness, light-headedness, numbness and headaches.  Hematological: Negative for adenopathy. Does not bruise/bleed easily.  Psychiatric/Behavioral: Negative for dysphoric mood. The patient is not nervous/anxious.         Objective:   Physical Exam  Constitutional: Colleen Boone appears well-developed and well-nourished. No distress.  HENT:  Head: Normocephalic and atraumatic.  Right Ear: External ear normal.  Left Ear: External ear normal.  Mouth/Throat: Oropharynx is clear and moist. No oropharyngeal exudate.  Nares are injected and congested   Mild maxillary sinus tenderness  Post nasal drip    Eyes: Conjunctivae and EOM are normal. Pupils are equal, round, and reactive to light. Right eye exhibits no discharge. Left eye exhibits no discharge.  Neck: Normal range of motion. Neck supple.  Cardiovascular: Normal rate, regular rhythm and normal heart sounds.   No murmur heard. Pulmonary/Chest: Effort normal. No respiratory distress. Colleen Boone has wheezes. Colleen Boone has no rales. Colleen Boone exhibits no tenderness.  Harsh bs with scattered rhonchi  No rales  Scant wheeze on forced expiration only   Lymphadenopathy:    Colleen Boone has no cervical adenopathy.  Neurological: Colleen Boone is alert.  Skin: Skin is warm and dry. No rash noted.  Psychiatric: Colleen Boone has a normal mood and affect.          Assessment & Plan:   Problem List Items Addressed This Visit     Respiratory   Acute bronchitis - Primary     2 weeks of symptoms  Works in ER - exp to hospital infections  Cover with zpack Tessalon for cough  Can use mucinex when working and robitussin ac for home/ sleep  Update if not starting to improve in a week or if worsening

## 2013-11-25 NOTE — Assessment & Plan Note (Signed)
2 weeks of symptoms  Works in [place] - exp to hospital infections  Cover with zpack Tessalon for cough  Can use mucinex when working and robitussin ac for home/ sleep  Update if not starting to improve in a week or if worsening

## 2013-11-25 NOTE — Progress Notes (Signed)
Pre visit review using our clinic review tool, if applicable. No additional management support is needed unless otherwise documented below in the visit note. 

## 2013-11-25 NOTE — Patient Instructions (Signed)
Drink fluids Rest when you can  Take zpack  Tessalon as needed for cough  For work- mucinex DM At home -use the codeine cough syrup  Update if not starting to improve in a week or if worsening

## 2013-12-08 ENCOUNTER — Other Ambulatory Visit: Payer: Self-pay | Admitting: Family Medicine

## 2013-12-09 ENCOUNTER — Telehealth: Payer: Self-pay

## 2013-12-09 MED ORDER — LOSARTAN POTASSIUM-HCTZ 50-12.5 MG PO TABS: 1.0000 | ORAL_TABLET | Freq: Every day | ORAL | Status: DC

## 2013-12-09 NOTE — Telephone Encounter (Signed)
Patient notified

## 2013-12-09 NOTE — Telephone Encounter (Signed)
Pt left v/m; pt has discussed with Dr Darnell Level previously about switching lisinopril HCTZ to another med due to occasional cough; pt wants to make sure new med has HCTZ or diuretic in combination with BP med. [facility]. Pt request cb.

## 2013-12-09 NOTE — Telephone Encounter (Signed)
Sent several months of losartan hctz for pt to try out. should help cough if cough related to lisinopril.

## 2013-12-12 ENCOUNTER — Ambulatory Visit: Payer: 59 | Admitting: Obstetrics & Gynecology

## 2013-12-20 ENCOUNTER — Other Ambulatory Visit: Payer: Self-pay

## 2013-12-27 ENCOUNTER — Encounter: Payer: Self-pay | Admitting: Obstetrics & Gynecology

## 2013-12-27 ENCOUNTER — Ambulatory Visit (INDEPENDENT_AMBULATORY_CARE_PROVIDER_SITE_OTHER): Payer: 59 | Admitting: Obstetrics & Gynecology

## 2013-12-27 VITALS — BP 119/92 | HR 82 | Ht 63.0 in | Wt 194.0 lb

## 2013-12-27 DIAGNOSIS — Z124 Encounter for screening for malignant neoplasm of cervix: Secondary | ICD-10-CM

## 2013-12-27 DIAGNOSIS — Z1151 Encounter for screening for human papillomavirus (HPV): Secondary | ICD-10-CM

## 2013-12-27 DIAGNOSIS — Z113 Encounter for screening for infections with a predominantly sexual mode of transmission: Secondary | ICD-10-CM

## 2013-12-27 DIAGNOSIS — Z01419 Encounter for gynecological examination (general) (routine) without abnormal findings: Secondary | ICD-10-CM

## 2013-12-27 DIAGNOSIS — Z0000 Encounter for general adult medical examination without abnormal findings: Secondary | ICD-10-CM

## 2013-12-27 NOTE — Progress Notes (Signed)
Subjective:    Colleen Boone is a 43 y.o. female who presents for an annual exam. She only has occasional spotting with the depo provera and is happy with this method. The patient has no complaints today. The patient is not currently sexually active. GYN screening history: last pap: was abnormal: ASCUS +HPV. The patient wears seatbelts: yes. The patient participates in regular exercise: yes. Has the patient ever been transfused or tattooed?: yes. The patient reports that there is not domestic violence in her life.   Menstrual History: OB History   Grav Para Term Preterm Abortions TAB SAB Ect Mult Living   2 1              Menarche age: 3  No LMP recorded. Patient has had an injection.    The following portions of the patient's history were reviewed and updated as appropriate: allergies, current medications, past family history, past medical history, past social history, past surgical history and problem list.  Review of Systems A comprehensive review of systems was negative. 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 Needs pap and mammogram.   Objective:    BP 119/92  Pulse 82  Ht 5\' 3"  (1.6 m)  Wt 194 lb (87.998 kg)  BMI 34.37 kg/m2  General Appearance:    Alert, cooperative, no distress, appears stated age  Head:    Normocephalic, without obvious abnormality, atraumatic  Eyes:    PERRL, conjunctiva/corneas clear, EOM's intact, fundi    benign, both eyes  Ears:    Normal TM's and external ear canals, both ears  Nose:   Nares normal, septum midline, mucosa normal, no drainage    or sinus tenderness  Throat:   Lips, mucosa, and tongue normal; teeth and gums normal  Neck:   Supple, symmetrical, trachea midline, no adenopathy;    thyroid:  no enlargement/tenderness/nodules; no carotid   bruit or JVD  Back:     Symmetric, no curvature, ROM normal, no CVA tenderness  Lungs:     Clear to auscultation bilaterally, respirations unlabored  Chest Wall:    No tenderness or deformity   Heart:    Regular rate and rhythm, S1 and S2 normal, no murmur, rub   or gallop  Breast Exam:    No tenderness, masses, or nipple abnormality  Abdomen:     Soft, non-tender, bowel sounds active all four quadrants,    no masses, no organomegaly  Genitalia:    Normal female without lesion, discharge or tenderness, shaved, NSSA, NT, mobile, normal adnexal masses     Extremities:   Extremities normal, atraumatic, no cyanosis or edema  Pulses:   2+ and symmetric all extremities  Skin:   Skin color, texture, turgor normal, no rashes or lesions  Lymph nodes:   Cervical, supraclavicular, and axillary nodes normal  Neurologic:   CNII-XII intact, normal strength, sensation and reflexes    throughout  .    Assessment:    Healthy female exam.    Plan:     Breast self exam technique reviewed and patient encouraged to perform self-exam monthly. Chlamydia specimen. GC specimen. Thin prep Pap smear. with cotesting

## 2013-12-28 LAB — HEPATITIS B SURFACE ANTIGEN: Hepatitis B Surface Ag: NEGATIVE

## 2013-12-28 LAB — HEPATITIS C ANTIBODY: HCV Ab: NEGATIVE

## 2013-12-28 LAB — RPR

## 2013-12-28 LAB — HIV ANTIBODY (ROUTINE TESTING W REFLEX): HIV 1&2 Ab, 4th Generation: NONREACTIVE

## 2013-12-30 LAB — CYTOLOGY - PAP

## 2014-01-06 ENCOUNTER — Encounter: Payer: Self-pay | Admitting: Obstetrics & Gynecology

## 2014-01-07 ENCOUNTER — Ambulatory Visit (HOSPITAL_COMMUNITY)
Admission: RE | Admit: 2014-01-07 | Discharge: 2014-01-07 | Disposition: A | Discharge status: Home / Self Care | Payer: 59 | Source: Ambulatory Visit | Attending: Obstetrics & Gynecology | Admitting: Obstetrics & Gynecology

## 2014-01-07 DIAGNOSIS — Z1231 Encounter for screening mammogram for malignant neoplasm of breast: Secondary | ICD-10-CM | POA: Insufficient documentation

## 2014-01-07 DIAGNOSIS — Z0000 Encounter for general adult medical examination without abnormal findings: Secondary | ICD-10-CM

## 2014-03-13 ENCOUNTER — Other Ambulatory Visit: Payer: Self-pay

## 2014-03-13 NOTE — Telephone Encounter (Signed)
Pt left v/m; having to change to [facility] outpatient pharmacy due to ins guidelines; pt request refill losartan-HCTZ and valtrex to [facility] outpt pharmacy. Pt last seen 10/03/13 for f/u visit. No future appt scheduled. Valtrex has been refilled by Dr Hulan Fray but pt wants to know if Dr Darnell Level could refill the valtrex for cold sores. Pt uses med prn not daily.Please advise.

## 2014-03-14 MED ORDER — VALACYCLOVIR HCL 1 G PO TABS: 1000.0000 mg | ORAL_TABLET | Freq: Every day | ORAL | Status: DC

## 2014-03-14 MED ORDER — LOSARTAN POTASSIUM-HCTZ 50-12.5 MG PO TABS: 1.0000 | ORAL_TABLET | Freq: Every day | ORAL | Status: DC

## 2014-06-10 ENCOUNTER — Ambulatory Visit (INDEPENDENT_AMBULATORY_CARE_PROVIDER_SITE_OTHER): Payer: 59 | Admitting: *Deleted

## 2014-06-10 DIAGNOSIS — Z3042 Encounter for surveillance of injectable contraceptive: Secondary | ICD-10-CM

## 2014-06-10 MED ORDER — MEDROXYPROGESTERONE ACETATE 150 MG/ML IM SUSP
150.0000 mg | INTRAMUSCULAR | Status: AC
  Administered 2014-06-10: 150 mg via INTRAMUSCULAR

## 2014-10-06 ENCOUNTER — Other Ambulatory Visit: Payer: Self-pay | Admitting: Family Medicine

## 2014-12-23 ENCOUNTER — Ambulatory Visit (INDEPENDENT_AMBULATORY_CARE_PROVIDER_SITE_OTHER): Payer: 59 | Admitting: Obstetrics & Gynecology

## 2014-12-23 ENCOUNTER — Encounter: Payer: Self-pay | Admitting: Obstetrics & Gynecology

## 2014-12-23 VITALS — BP 118/81 | HR 85 | Ht 63.0 in | Wt 180.0 lb

## 2014-12-23 DIAGNOSIS — Z0000 Encounter for general adult medical examination without abnormal findings: Secondary | ICD-10-CM

## 2014-12-23 DIAGNOSIS — Z1151 Encounter for screening for human papillomavirus (HPV): Secondary | ICD-10-CM

## 2014-12-23 DIAGNOSIS — Z01419 Encounter for gynecological examination (general) (routine) without abnormal findings: Secondary | ICD-10-CM | POA: Diagnosis not present

## 2014-12-23 DIAGNOSIS — Z113 Encounter for screening for infections with a predominantly sexual mode of transmission: Secondary | ICD-10-CM | POA: Diagnosis not present

## 2014-12-23 DIAGNOSIS — Z124 Encounter for screening for malignant neoplasm of cervix: Secondary | ICD-10-CM | POA: Diagnosis not present

## 2014-12-23 NOTE — Progress Notes (Signed)
Subjective:    Colleen Boone is a 44 y.o. SW  female who presents for an annual exam. The patient has no complaints today. The patient is sexually active. GYN screening history: last pap: was normal. The patient wears seatbelts: yes. The patient participates in regular exercise: yes. Has the patient ever been transfused or tattooed?: yes. The patient reports that there is not domestic violence in her life.   Menstrual History: OB History    Gravida Para Term Preterm AB TAB SAB Ectopic Multiple Living   2 1              Menarche age: 69  No LMP recorded. Patient has had an injection.    The following portions of the patient's history were reviewed and updated as appropriate: allergies, current medications, past family history, past medical history, past social history, past surgical history and problem list.  Review of Systems Pertinent items are noted in HPI. She is a Futures trader. She has had her flu vaccine and needs her pap and mammogram.   Objective:    BP 118/81 mmHg  Pulse 85  Ht 5\' 3"  (1.6 m)  Wt 180 lb (81.647 kg)  BMI 31.89 kg/m2  General Appearance:    Alert, cooperative, no distress, appears stated age  Head:    Normocephalic, without obvious abnormality, atraumatic  Eyes:    PERRL, conjunctiva/corneas clear, EOM's intact, fundi    benign, both eyes  Ears:    Normal TM's and external ear canals, both ears  Nose:   Nares normal, septum midline, mucosa normal, no drainage    or sinus tenderness  Throat:   Lips, mucosa, and tongue normal; teeth and gums normal  Neck:   Supple, symmetrical, trachea midline, no adenopathy;    thyroid:  no enlargement/tenderness/nodules; no carotid   bruit or JVD  Back:     Symmetric, no curvature, ROM normal, no CVA tenderness  Lungs:     Clear to auscultation bilaterally, respirations unlabored  Chest Wall:    No tenderness or deformity   Heart:    Regular rate and rhythm, S1 and S2 normal, no murmur, rub   or gallop  Breast Exam:     No tenderness, masses, or nipple abnormality  Abdomen:     Soft, non-tender, bowel sounds active all four quadrants,    no masses, no organomegaly  Genitalia:    Normal female without lesion, discharge or tenderness, shaved, NSSA, NT, normal adnexal exam     Extremities:   Extremities normal, atraumatic, no cyanosis or edema  Pulses:   2+ and symmetric all extremities  Skin:   Skin color, texture, turgor normal, no rashes or lesions  Lymph nodes:   Cervical, supraclavicular, and axillary nodes normal  Neurologic:   CNII-XII intact, normal strength, sensation and reflexes    throughout  .    Assessment:    Healthy female exam.    Plan:     Breast self exam technique reviewed and patient encouraged to perform self-exam monthly. Mammogram. Thin prep Pap smear. with cotesting SI testing per patient request

## 2014-12-24 LAB — HEPATITIS B SURFACE ANTIGEN: Hepatitis B Surface Ag: NEGATIVE

## 2014-12-24 LAB — HEPATITIS C ANTIBODY: HCV Ab: NEGATIVE

## 2014-12-24 LAB — HIV ANTIBODY (ROUTINE TESTING W REFLEX): HIV 1&2 Ab, 4th Generation: NONREACTIVE

## 2014-12-24 LAB — RPR

## 2014-12-24 LAB — CYTOLOGY - PAP

## 2015-01-21 ENCOUNTER — Ambulatory Visit
Admission: RE | Admit: 2015-01-21 | Discharge: 2015-01-21 | Disposition: A | Discharge status: Home / Self Care | Payer: 59 | Source: Ambulatory Visit | Attending: Obstetrics & Gynecology | Admitting: Obstetrics & Gynecology

## 2015-01-21 DIAGNOSIS — Z0000 Encounter for general adult medical examination without abnormal findings: Secondary | ICD-10-CM

## 2015-03-17 ENCOUNTER — Other Ambulatory Visit: Payer: Self-pay

## 2015-03-17 MED ORDER — LOSARTAN POTASSIUM-HCTZ 50-12.5 MG PO TABS: 1.0000 | ORAL_TABLET | Freq: Every day | ORAL | Status: DC

## 2015-03-17 NOTE — Telephone Encounter (Signed)
Pt left v/m requesting ref[facility]. Last seen 10/03/13/ pt scheduled CPX 04/14/15 wit dr Darnell Level. Refill # 30 until seen.

## 2015-03-27 ENCOUNTER — Ambulatory Visit: Payer: Self-pay | Admitting: Family Medicine

## 2015-03-27 MED FILL — LOSARTAN-HCTZ 50-12.5 MG TA: 50-12.5 | 30 days supply | Qty: 30 | Fill #0

## 2015-04-14 ENCOUNTER — Ambulatory Visit (INDEPENDENT_AMBULATORY_CARE_PROVIDER_SITE_OTHER): Payer: 59 | Admitting: Family Medicine

## 2015-04-14 ENCOUNTER — Encounter: Payer: Self-pay | Admitting: Family Medicine

## 2015-04-14 VITALS — BP 118/82 | HR 84 | Temp 98.6°F | Wt 187.5 lb

## 2015-04-14 DIAGNOSIS — Z0000 Encounter for general adult medical examination without abnormal findings: Secondary | ICD-10-CM | POA: Diagnosis not present

## 2015-04-14 DIAGNOSIS — I10 Essential (primary) hypertension: Secondary | ICD-10-CM

## 2015-04-14 DIAGNOSIS — K219 Gastro-esophageal reflux disease without esophagitis: Secondary | ICD-10-CM

## 2015-04-14 DIAGNOSIS — E669 Obesity, unspecified: Secondary | ICD-10-CM

## 2015-04-14 DIAGNOSIS — R739 Hyperglycemia, unspecified: Secondary | ICD-10-CM

## 2015-04-14 DIAGNOSIS — E785 Hyperlipidemia, unspecified: Secondary | ICD-10-CM | POA: Diagnosis not present

## 2015-04-14 DIAGNOSIS — R7303 Prediabetes: Secondary | ICD-10-CM

## 2015-04-14 DIAGNOSIS — D259 Leiomyoma of uterus, unspecified: Secondary | ICD-10-CM | POA: Diagnosis not present

## 2015-04-14 LAB — LIPID PANEL
CHOL/HDL RATIO: 5
Cholesterol: 255 mg/dL — ABNORMAL HIGH (ref 0–200)
HDL: 49.5 mg/dL (ref >39.00)
LDL Cholesterol: 187 mg/dL — ABNORMAL HIGH (ref 0–99)
NONHDL: 205.47
Triglycerides: 92 mg/dL (ref 0.0–149.0)
VLDL: 18.4 mg/dL (ref 0.0–40.0)

## 2015-04-14 LAB — COMPREHENSIVE METABOLIC PANEL
ALT: 19 U/L (ref 0–35)
AST: 18 U/L (ref 0–37)
Albumin: 4.4 g/dL (ref 3.5–5.2)
Alkaline Phosphatase: 67 U/L (ref 39–117)
BUN: 12 mg/dL (ref 6–23)
CHLORIDE: 107 meq/L (ref 96–112)
CO2: 28 meq/L (ref 19–32)
CREATININE: 0.82 mg/dL (ref 0.40–1.20)
Calcium: 9.9 mg/dL (ref 8.4–10.5)
GFR: 80.09 mL/min (ref >60.00)
Glucose, Bld: 107 mg/dL — ABNORMAL HIGH (ref 70–99)
POTASSIUM: 4.1 meq/L (ref 3.5–5.1)
SODIUM: 140 meq/L (ref 135–145)
Total Bilirubin: 0.7 mg/dL (ref 0.2–1.2)
Total Protein: 7.3 g/dL (ref 6.0–8.3)

## 2015-04-14 LAB — CBC WITH DIFFERENTIAL/PLATELET
BASOS PCT: 2 % (ref 0.0–3.0)
Basophils Absolute: 0.1 10*3/uL (ref 0.0–0.1)
EOS ABS: 0.2 10*3/uL (ref 0.0–0.7)
EOS PCT: 2.6 % (ref 0.0–5.0)
HEMATOCRIT: 44.4 % (ref 36.0–46.0)
Hemoglobin: 14.6 g/dL (ref 12.0–15.0)
LYMPHS PCT: 33.7 % (ref 12.0–46.0)
Lymphs Abs: 2.2 10*3/uL (ref 0.7–4.0)
MCHC: 32.9 g/dL (ref 30.0–36.0)
MCV: 93.5 fl (ref 78.0–100.0)
MONO ABS: 0.5 10*3/uL (ref 0.1–1.0)
Monocytes Relative: 8.2 % (ref 3.0–12.0)
NEUTROS ABS: 3.6 10*3/uL (ref 1.4–7.7)
Neutrophils Relative %: 53.5 % (ref 43.0–77.0)
PLATELETS: 331 10*3/uL (ref 150.0–400.0)
RBC: 4.74 Mil/uL (ref 3.87–5.11)
RDW: 13.1 % (ref 11.5–15.5)
WBC: 6.6 10*3/uL (ref 4.0–10.5)

## 2015-04-14 LAB — HEMOGLOBIN A1C: Hgb A1c MFr Bld: 5.5 % (ref 4.6–6.5)

## 2015-04-14 MED ORDER — LOSARTAN POTASSIUM-HCTZ 50-12.5 MG PO TABS: 1.0000 | ORAL_TABLET | Freq: Every day | ORAL | Status: DC

## 2015-04-14 MED ORDER — PHENTERMINE HCL 37.5 MG PO CAPS: 37.5000 mg | ORAL_CAPSULE | ORAL | Status: DC

## 2015-04-14 NOTE — Assessment & Plan Note (Signed)
Check A1c. 

## 2015-04-14 NOTE — Progress Notes (Signed)
BP 118/82 mmHg  Pulse 84  Temp(Src) 98.6 F (37 C) (Oral)  Wt 187 lb 8 oz (85.049 kg)   CC: CPE  Subjective:    Patient ID: Colleen Boone, female    DOB: [birth year], 45 y.o.   MRN: [id number]  HPI: Colleen Boone is a 45 y.o. female presenting on 04/14/2015 for Annual Exam   Obesity - prior saw bariatric clinic and had good success. Struggles with night time eating. Plan is to restart going to gym with son. No chest pain, headaches, insomnia while on medicine.   Preventative: Well woman at [facility]). 12/2014 normal pap and mammo. On depo for contraception. Sees dentist and eye doctor regularly. Flu shot yearly Tdap 2013 Seat belt use discussed. Sunscreen use discussed. No changing moles on skin.  Caffeine: 1 cup coffee/day Lives with son (1998), 2 dogs Occupation: Therapist, sports at [employer], working on [employer] Activity: gym 2-3x/wk, cardio, back in school Diet: not lots of fruits. Drinks water.  Relevant past medical, surgical, family and social history reviewed and updated as indicated. Interim medical history since our last visit reviewed. Allergies and medications reviewed and updated. Current Outpatient Prescriptions on File Prior to Visit  Medication Sig  . esomeprazole (NEXIUM) 20 MG capsule Take 20 mg by mouth daily at 12 noon.  . medroxyPROGESTERone (DEPO-PROVERA) 150 MG/ML injection INJECT 1 ML (150 MG TOTAL) INTO THE MUSCLE EVERY 3 (THREE) MONTHS.  Marland Kitchen valACYclovir (VALTREX) 1000 MG tablet Take 1 tablet (1,000 mg total) by mouth daily.   Current Facility-Administered Medications on File Prior to Visit  Medication  . medroxyPROGESTERone (DEPO-PROVERA) injection 150 mg    Review of Systems  Constitutional: Negative for fever, chills, activity change, appetite change, fatigue and unexpected weight change.  HENT: Negative for hearing loss.   Eyes: Negative for visual disturbance.  Respiratory: Positive for cough (occasional). Negative for chest  tightness, shortness of breath and wheezing.   Cardiovascular: Negative for chest pain, palpitations and leg swelling.  Gastrointestinal: Positive for blood in stool (bright red when wiping). Negative for nausea, vomiting, abdominal pain, diarrhea, constipation and abdominal distention.  Genitourinary: Negative for hematuria and difficulty urinating.  Musculoskeletal: Negative for myalgias, arthralgias and neck pain.  Skin: Negative for rash.  Neurological: Negative for dizziness, seizures, syncope and headaches.  Hematological: Negative for adenopathy. Does not bruise/bleed easily.  Psychiatric/Behavioral: Negative for dysphoric mood. The patient is not nervous/anxious.    Per HPI unless specifically indicated in ROS section     Objective:    BP 118/82 mmHg  Pulse 84  Temp(Src) 98.6 F (37 C) (Oral)  Wt 187 lb 8 oz (85.049 kg)  Wt Readings from Last 3 Encounters:  04/14/15 187 lb 8 oz (85.049 kg)  12/23/14 180 lb (81.647 kg)  12/27/13 194 lb (87.998 kg)   Body mass index is 33.22 kg/(m^2).  Physical Exam  Constitutional: She is oriented to person, place, and time. She appears well-developed and well-nourished. No distress.  HENT:  Head: Normocephalic and atraumatic.  Right Ear: Hearing, tympanic membrane, external ear and ear canal normal.  Left Ear: Hearing, tympanic membrane, external ear and ear canal normal.  Nose: Nose normal.  Mouth/Throat: Uvula is midline, oropharynx is clear and moist and mucous membranes are normal. No oropharyngeal exudate, posterior oropharyngeal edema or posterior oropharyngeal erythema.  Eyes: Conjunctivae and EOM are normal. Pupils are equal, round, and reactive to light. No scleral icterus.  Neck: Normal range of motion. Neck supple. No  thyromegaly present.  Cardiovascular: Normal rate, regular rhythm, normal heart sounds and intact distal pulses.   No murmur heard. Pulses:      Radial pulses are 2+ on the right side, and 2+ on the left side.    Pulmonary/Chest: Effort normal and breath sounds normal. No respiratory distress. She has no wheezes. She has no rales.  Abdominal: Soft. Bowel sounds are normal. She exhibits no distension and no mass. There is no tenderness. There is no rebound and no guarding.  Musculoskeletal: Normal range of motion. She exhibits no edema.  Lymphadenopathy:    She has no cervical adenopathy.  Neurological: She is alert and oriented to person, place, and time.  CN grossly intact, station and gait intact  Skin: Skin is warm and dry. Rash noted.  Dry macule L lateral lowre leg  Psychiatric: She has a normal mood and affect. Her behavior is normal. Judgment and thought content normal.  Nursing note and vitals reviewed.     Assessment & Plan:   Problem List Items Addressed This Visit    Prediabetes    Check A1c.      Obesity, Class I, BMI 30-34.9    Reviewed diet and lifestyle changes - motivated to restart working out at gym 3d/wk. Prior on phentermine per bariatric clinic and this was successful, but clinic became too expensive. Requests retrial of phentermine. Filled x3 mo, RTC 2-3 mo obesity f/u. Pt agrees. Aware of side effects to monitor for.      Relevant Medications   phentermine 37.5 MG capsule   HTN (hypertension)    Chronic stable. Continue current regimen.      Relevant Medications   losartan-hydrochlorothiazide (HYZAAR) 50-12.5 MG tablet   Other Relevant Orders   Comprehensive metabolic panel   HLD (hyperlipidemia)    Recheck FLP - off cholesterol medication.      Relevant Medications   losartan-hydrochlorothiazide (HYZAAR) 50-12.5 MG tablet   Other Relevant Orders   Lipid panel   Healthcare maintenance - Primary    Preventative protocols reviewed and updated unless pt declined. Discussed healthy diet and lifestyle.       GERD (gastroesophageal reflux disease)    Stable on low dose nexium - continue          Follow up plan: Return in about 3 months (around  07/12/2015), or as needed, for follow up visit.

## 2015-04-14 NOTE — Assessment & Plan Note (Signed)
Stable on low dose nexium - continue

## 2015-04-14 NOTE — Assessment & Plan Note (Signed)
Recheck FLP - off cholesterol medication.

## 2015-04-14 NOTE — Patient Instructions (Addendum)
Labs today We will restart phentermine 37.'5mg'$  daily. Return in 2-3 months for follow up.  Good to see you today, call us with questions.  Health Maintenance, Female Adopting a healthy lifestyle and getting preventive care can go a long way to promote health and wellness. Talk with your health care provider about what schedule of regular examinations is right for you. This is a good chance for you to check in with your provider about disease prevention and staying healthy. In between checkups, there are plenty of things you can do on your own. Experts have done a lot of research about which lifestyle changes and preventive measures are most likely to keep you healthy. Ask your health care provider for more information. WEIGHT AND DIET  Eat a healthy diet  Be sure to include plenty of vegetables, fruits, low-fat dairy products, and lean protein.  Do not eat a lot of foods high in solid fats, added sugars, or salt.  Get regular exercise. This is one of the most important things you can do for your health.  Most adults should exercise for at least 150 minutes each week. The exercise should increase your heart rate and make you sweat (moderate-intensity exercise).  Most adults should also do strengthening exercises at least twice a week. This is in addition to the moderate-intensity exercise.  Maintain a healthy weight  Body mass index (BMI) is a measurement that can be used to identify possible weight problems. It estimates body fat based on height and weight. Your health care provider can help determine your BMI and help you achieve or maintain a healthy weight.  For females 16 years of age and older:   A BMI below 18.5 is considered underweight.  A BMI of 18.5 to 24.9 is normal.  A BMI of 25 to 29.9 is considered overweight.  A BMI of 30 and above is considered obese.  Watch levels of cholesterol and blood lipids  You should start having your blood tested for lipids and cholesterol  at 45 years of age, then have this test every 5 years.  You may need to have your cholesterol levels checked more often if:  Your lipid or cholesterol levels are high.  You are older than 45 years of age.  You are at high risk for heart disease.  CANCER SCREENING   Lung Cancer  Lung cancer screening is recommended for adults 47-75 years old who are at high risk for lung cancer because of a history of smoking.  A yearly low-dose CT scan of the lungs is recommended for people who:  Currently smoke.  Have quit within the past 15 years.  Have at least a 30-pack-year history of smoking. A pack year is smoking an average of one pack of cigarettes a day for 1 year.  Yearly screening should continue until it has been 15 years since you quit.  Yearly screening should stop if you develop a health problem that would prevent you from having lung cancer treatment.  Breast Cancer  Practice breast self-awareness. This means understanding how your breasts normally appear and feel.  It also means doing regular breast self-exams. Let your health care provider know about any changes, no matter how small.  If you are in your 20s or 30s, you should have a clinical breast exam (CBE) by a health care provider every 1-3 years as part of a regular health exam.  If you are 65 or older, have a CBE every year. Also consider having a breast  X-ray (mammogram) every year.  If you have a family history of breast cancer, talk to your health care provider about genetic screening.  If you are at high risk for breast cancer, talk to your health care provider about having an MRI and a mammogram every year.  Breast cancer gene (BRCA) assessment is recommended for women who have family members with BRCA-related cancers. BRCA-related cancers include:  Breast.  Ovarian.  Tubal.  Peritoneal cancers.  Results of the assessment will determine the need for genetic counseling and BRCA1 and BRCA2  testing. Cervical Cancer Your health care provider may recommend that you be screened regularly for cancer of the pelvic organs (ovaries, uterus, and vagina). This screening involves a pelvic examination, including checking for microscopic changes to the surface of your cervix (Pap test). You may be encouraged to have this screening done every 3 years, beginning at age 44.  For women ages 59-65, health care providers may recommend pelvic exams and Pap testing every 3 years, or they may recommend the Pap and pelvic exam, combined with testing for human papilloma virus (HPV), every 5 years. Some types of HPV increase your risk of cervical cancer. Testing for HPV may also be done on women of any age with unclear Pap test results.  Other health care providers may not recommend any screening for nonpregnant women who are considered low risk for pelvic cancer and who do not have symptoms. Ask your health care provider if a screening pelvic exam is right for you.  If you have had past treatment for cervical cancer or a condition that could lead to cancer, you need Pap tests and screening for cancer for at least 20 years after your treatment. If Pap tests have been discontinued, your risk factors (such as having a new sexual partner) need to be reassessed to determine if screening should resume. Some women have medical problems that increase the chance of getting cervical cancer. In these cases, your health care provider may recommend more frequent screening and Pap tests. Colorectal Cancer  This type of cancer can be detected and often prevented.  Routine colorectal cancer screening usually begins at 45 years of age and continues through 45 years of age.  Your health care provider may recommend screening at an earlier age if you have risk factors for colon cancer.  Your health care provider may also recommend using home test kits to check for hidden blood in the stool.  A small camera at the end of a  tube can be used to examine your colon directly (sigmoidoscopy or colonoscopy). This is done to check for the earliest forms of colorectal cancer.  Routine screening usually begins at age 39.  Direct examination of the colon should be repeated every 5-10 years through 44 years of age. However, you may need to be screened more often if early forms of precancerous polyps or small growths are found. Skin Cancer  Check your skin from head to toe regularly.  Tell your health care provider about any new moles or changes in moles, especially if there is a change in a mole's shape or color.  Also tell your health care provider if you have a mole that is larger than the size of a pencil eraser.  Always use sunscreen. Apply sunscreen liberally and repeatedly throughout the day.  Protect yourself by wearing long sleeves, pants, a wide-brimmed hat, and sunglasses whenever you are outside. HEART DISEASE, DIABETES, AND HIGH BLOOD PRESSURE   High blood pressure causes heart  disease and increases the risk of stroke. High blood pressure is more likely to develop in:  People who have blood pressure in the high end of the normal range (130-139/85-89 mm Hg).  People who are overweight or obese.  People who are African American.  If you are 71-41 years of age, have your blood pressure checked every 3-5 years. If you are 58 years of age or older, have your blood pressure checked every year. You should have your blood pressure measured twice--once when you are at a hospital or clinic, and once when you are not at a hospital or clinic. Record the average of the two measurements. To check your blood pressure when you are not at a hospital or clinic, you can use:  An automated blood pressure machine at a pharmacy.  A home blood pressure monitor.  If you are between 61 years and 78 years old, ask your health care provider if you should take aspirin to prevent strokes.  Have regular diabetes screenings. This  involves taking a blood sample to check your fasting blood sugar level.  If you are at a normal weight and have a low risk for diabetes, have this test once every three years after 45 years of age.  If you are overweight and have a high risk for diabetes, consider being tested at a younger age or more often. PREVENTING INFECTION  Hepatitis B  If you have a higher risk for hepatitis B, you should be screened for this virus. You are considered at high risk for hepatitis B if:  You were born in a country where hepatitis B is common. Ask your health care provider which countries are considered high risk.  Your parents were born in a high-risk country, and you have not been immunized against hepatitis B (hepatitis B vaccine).  You have HIV or AIDS.  You use needles to inject street drugs.  You live with someone who has hepatitis B.  You have had sex with someone who has hepatitis B.  You get hemodialysis treatment.  You take certain medicines for conditions, including cancer, organ transplantation, and autoimmune conditions. Hepatitis C  Blood testing is recommended for:  Everyone born from [birth year] through 1965.  Anyone with known risk factors for hepatitis C. Sexually transmitted infections (STIs)  You should be screened for sexually transmitted infections (STIs) including gonorrhea and chlamydia if:  You are sexually active and are younger than 45 years of age.  You are older than 45 years of age and your health care provider tells you that you are at risk for this type of infection.  Your sexual activity has changed since you were last screened and you are at an increased risk for chlamydia or gonorrhea. Ask your health care provider if you are at risk.  If you do not have HIV, but are at risk, it may be recommended that you take a prescription medicine daily to prevent HIV infection. This is called pre-exposure prophylaxis (PrEP). You are considered at risk if:  You are  sexually active and do not regularly use condoms or know the HIV status of your partner(s).  You take drugs by injection.  You are sexually active with a partner who has HIV. Talk with your health care provider about whether you are at high risk of being infected with HIV. If you choose to begin PrEP, you should first be tested for HIV. You should then be tested every 3 months for as long as you are taking  PrEP.  PREGNANCY   If you are premenopausal and you may become pregnant, ask your health care provider about preconception counseling.  If you may become pregnant, take 400 to 800 micrograms (mcg) of folic acid every day.  If you want to prevent pregnancy, talk to your health care provider about birth control (contraception). OSTEOPOROSIS AND MENOPAUSE   Osteoporosis is a disease in which the bones lose minerals and strength with aging. This can result in serious bone fractures. Your risk for osteoporosis can be identified using a bone density scan.  If you are 65 years of age or older, or if you are at risk for osteoporosis and fractures, ask your health care provider if you should be screened.  Ask your health care provider whether you should take a calcium or vitamin D supplement to lower your risk for osteoporosis.  Menopause may have certain physical symptoms and risks.  Hormone replacement therapy may reduce some of these symptoms and risks. Talk to your health care provider about whether hormone replacement therapy is right for you.  HOME CARE INSTRUCTIONS   Schedule regular health, dental, and eye exams.  Stay current with your immunizations.   Do not use any tobacco products including cigarettes, chewing tobacco, or electronic cigarettes.  If you are pregnant, do not drink alcohol.  If you are breastfeeding, limit how much and how often you drink alcohol.  Limit alcohol intake to no more than 1 drink per day for nonpregnant women. One drink equals 12 ounces of beer, 5  ounces of wine, or 1 ounces of hard liquor.  Do not use street drugs.  Do not share needles.  Ask your health care provider for help if you need support or information about quitting drugs.  Tell your health care provider if you often feel depressed.  Tell your health care provider if you have ever been abused or do not feel safe at home.   This information is not intended to replace advice given to you by your health care provider. Make sure you discuss any questions you have with your health care provider.   Document Released: 09/06/2010 Document Revised: 03/14/2014 Document Reviewed: 01/23/2013 Elsevier Interactive Patient Education [employer].

## 2015-04-14 NOTE — Assessment & Plan Note (Signed)
Chronic stable. Continue current regimen. 

## 2015-04-14 NOTE — Assessment & Plan Note (Signed)
Reviewed diet and lifestyle changes - motivated to restart working out at gym 3d/wk. Prior on phentermine per bariatric clinic and this was successful, but clinic became too expensive. Requests retrial of phentermine. Filled x3 mo, RTC 2-3 mo obesity f/u. Pt agrees. Aware of side effects to monitor for.

## 2015-04-14 NOTE — Assessment & Plan Note (Signed)
Preventative protocols reviewed and updated unless pt declined. Discussed healthy diet and lifestyle.  

## 2015-04-14 NOTE — Progress Notes (Signed)
Pre visit review using our clinic review tool, if applicable. No additional management support is needed unless otherwise documented below in the visit note. 

## 2015-04-17 ENCOUNTER — Telehealth: Payer: Self-pay | Admitting: Family Medicine

## 2015-04-17 NOTE — Telephone Encounter (Signed)
See result note. Released via [facility].

## 2015-04-17 NOTE — Telephone Encounter (Signed)
Patient called to get her lab results. 

## 2015-04-27 MED FILL — LOSARTAN-HCTZ 50-12.5 MG TA: 50-12.5 | 90 days supply | Qty: 90 | Fill #0

## 2015-07-13 ENCOUNTER — Ambulatory Visit: Payer: 59 | Admitting: Family Medicine

## 2015-07-23 MED FILL — LOSARTAN-HCTZ 50-12.5 MG TA: 50-12.5 | 90 days supply | Qty: 90 | Fill #1

## 2015-08-07 ENCOUNTER — Ambulatory Visit: Payer: Self-pay | Admitting: Family Medicine

## 2015-08-24 ENCOUNTER — Encounter: Payer: Self-pay | Admitting: Family Medicine

## 2015-08-24 ENCOUNTER — Ambulatory Visit (INDEPENDENT_AMBULATORY_CARE_PROVIDER_SITE_OTHER): Payer: 59 | Admitting: Family Medicine

## 2015-08-24 ENCOUNTER — Telehealth: Payer: Self-pay | Admitting: *Deleted

## 2015-08-24 VITALS — BP 122/86 | HR 88 | Temp 98.3°F | Ht 63.5 in | Wt 186.8 lb

## 2015-08-24 DIAGNOSIS — E785 Hyperlipidemia, unspecified: Secondary | ICD-10-CM | POA: Diagnosis not present

## 2015-08-24 DIAGNOSIS — E669 Obesity, unspecified: Secondary | ICD-10-CM

## 2015-08-24 LAB — LDL CHOLESTEROL, DIRECT: Direct LDL: 151 mg/dL

## 2015-08-24 LAB — LIPID PANEL
CHOL/HDL RATIO: 6
CHOLESTEROL: 228 mg/dL — AB (ref 0–200)
HDL: 40.9 mg/dL (ref >39.00)
NONHDL: 187.44
Triglycerides: 202 mg/dL — ABNORMAL HIGH (ref 0.0–149.0)
VLDL: 40.4 mg/dL — ABNORMAL HIGH (ref 0.0–40.0)

## 2015-08-24 MED ORDER — LORCASERIN HCL 10 MG PO TABS: 10.0000 mg | ORAL_TABLET | Freq: Two times a day (BID) | ORAL | Status: DC

## 2015-08-24 NOTE — Assessment & Plan Note (Signed)
Phentermine ineffective for weight loss - predominant trouble with snacking at night time, phentermine taken during the day. Discussed options including saxenda, belviq, qsymia, contrave. Pt interested in trial of belviq. Coupon provided, Rx printed, RTC 3 mo. Discussed mechanism of action and common side effects to monitor.

## 2015-08-24 NOTE — Assessment & Plan Note (Signed)
Recheck FLP - pt states has been eating healthier - less fatty fried foods. Would be willing to consider retrial statin if LDL remaining elevated.

## 2015-08-24 NOTE — Addendum Note (Signed)
Addended by: Ellamae Sia on: 08/24/2015 10:03 AM   Modules accepted: SmartSet

## 2015-08-24 NOTE — Telephone Encounter (Signed)
Called pt back about changing contraception. LMOM for her to rtn call for appt.

## 2015-08-24 NOTE — Progress Notes (Signed)
Pre visit review using our clinic review tool, if applicable. No additional management support is needed unless otherwise documented below in the visit note. 

## 2015-08-24 NOTE — Telephone Encounter (Signed)
-----   Message from Francia Greaves sent at 08/24/2015 10:11 AM EDT ----- Regarding: Birth Control  Contact: [phone] Been using Depo, cant get rid of extra weight, wants to know if there is something else she can use (has issues w/ estrogen)

## 2015-08-24 NOTE — Patient Instructions (Addendum)
Labs today  Stop phentermine Start belviq - sent to pharmacy. Start 1 pill daily for 1 week then increase to twice daily.  Return in 3 months for follow up. Work on incorporating exercise into routine.

## 2015-08-24 NOTE — Progress Notes (Signed)
BP 122/86 mmHg  Pulse 88  Temp(Src) 98.3 F (36.8 C) (Oral)  Ht 5' 3.5" (1.613 m)  Wt 186 lb 12 oz (84.709 kg)  BMI 32.56 kg/m2   CC: f/u visit  Subjective:    Patient ID: Colleen Boone, female    DOB: 1970/11/08, 45 y.o.   MRN: [id number]  HPI: Colleen Boone is a 45 y.o. female presenting on 08/24/2015 for Annual Exam   See prior note for details. Last visit 04/2015 we restarted phentermine for weight loss. Did help with energy but didn't help weight loss. Main difficulty is snacking at night time when she gets home from long shift.   Tries to eat healthy.  More trouble with exercise.   HLD - requests recheck FLP today. Fatigue and myalgias with simvastatin.   Relevant past medical, surgical, family and social history reviewed and updated as indicated. Interim medical history since our last visit reviewed. Allergies and medications reviewed and updated. Current Outpatient Prescriptions on File Prior to Visit  Medication Sig  . esomeprazole (NEXIUM) 20 MG capsule Take 20 mg by mouth daily at 12 noon.  Marland Kitchen losartan-hydrochlorothiazide (HYZAAR) 50-12.5 MG tablet Take 1 tablet by mouth daily.  . medroxyPROGESTERone (DEPO-PROVERA) 150 MG/ML injection INJECT 1 ML (150 MG TOTAL) INTO THE MUSCLE EVERY 3 (THREE) MONTHS.  Marland Kitchen valACYclovir (VALTREX) 1000 MG tablet Take 1 tablet (1,000 mg total) by mouth daily.   Current Facility-Administered Medications on File Prior to Visit  Medication  . medroxyPROGESTERone (DEPO-PROVERA) injection 150 mg    Review of Systems Per HPI unless specifically indicated in ROS section     Objective:    BP 122/86 mmHg  Pulse 88  Temp(Src) 98.3 F (36.8 C) (Oral)  Ht 5' 3.5" (1.613 m)  Wt 186 lb 12 oz (84.709 kg)  BMI 32.56 kg/m2  Wt Readings from Last 3 Encounters:  08/24/15 186 lb 12 oz (84.709 kg)  04/14/15 187 lb 8 oz (85.049 kg)  12/23/14 180 lb (81.647 kg)    Physical Exam  Constitutional: She appears well-developed and well-nourished.  No distress.  HENT:  Mouth/Throat: Oropharynx is clear and moist. No oropharyngeal exudate.  Cardiovascular: Normal rate, regular rhythm, normal heart sounds and intact distal pulses.   No murmur heard. Pulmonary/Chest: Effort normal and breath sounds normal. No respiratory distress. She has no wheezes. She has no rales.  Musculoskeletal: She exhibits no edema.  Skin: Skin is warm and dry. No rash noted.  Psychiatric: She has a normal mood and affect.  Nursing note and vitals reviewed.  Results for orders placed or performed in visit on 04/14/15  Lipid panel  Result Value Ref Range   Cholesterol 255 (H) 0 - 200 mg/dL   Triglycerides 92.0 0.0 - 149.0 mg/dL   HDL 49.50 >39.00 mg/dL   VLDL 18.4 0.0 - 40.0 mg/dL   LDL Cholesterol 187 (H) 0 - 99 mg/dL   Total CHOL/HDL Ratio 5    NonHDL 205.47   Hemoglobin A1c  Result Value Ref Range   Hgb A1c MFr Bld 5.5 4.6 - 6.5 %  Comprehensive metabolic panel  Result Value Ref Range   Sodium 140 135 - 145 mEq/L   Potassium 4.1 3.5 - 5.1 mEq/L   Chloride 107 96 - 112 mEq/L   CO2 28 19 - 32 mEq/L   Glucose, Bld 107 (H) 70 - 99 mg/dL   BUN 12 6 - 23 mg/dL   Creatinine, Ser 0.82 0.40 - 1.20 mg/dL   Total  Bilirubin 0.7 0.2 - 1.2 mg/dL   Alkaline Phosphatase 67 39 - 117 U/L   AST 18 0 - 37 U/L   ALT 19 0 - 35 U/L   Total Protein 7.3 6.0 - 8.3 g/dL   Albumin 4.4 3.5 - 5.2 g/dL   Calcium 9.9 8.4 - 10.5 mg/dL   GFR 80.09 >60.00 mL/min  CBC with Differential/Platelet  Result Value Ref Range   WBC 6.6 4.0 - 10.5 K/uL   RBC 4.74 3.87 - 5.11 Mil/uL   Hemoglobin 14.6 12.0 - 15.0 g/dL   HCT 44.4 36.0 - 46.0 %   MCV 93.5 78.0 - 100.0 fl   MCHC 32.9 30.0 - 36.0 g/dL   RDW 13.1 11.5 - 15.5 %   Platelets 331.0 150.0 - 400.0 K/uL   Neutrophils Relative % 53.5 43.0 - 77.0 %   Lymphocytes Relative 33.7 12.0 - 46.0 %   Monocytes Relative 8.2 3.0 - 12.0 %   Eosinophils Relative 2.6 0.0 - 5.0 %   Basophils Relative 2.0 0.0 - 3.0 %   Neutro Abs 3.6 1.4 -  7.7 K/uL   Lymphs Abs 2.2 0.7 - 4.0 K/uL   Monocytes Absolute 0.5 0.1 - 1.0 K/uL   Eosinophils Absolute 0.2 0.0 - 0.7 K/uL   Basophils Absolute 0.1 0.0 - 0.1 K/uL      Assessment & Plan:   Problem List Items Addressed This Visit    HLD (hyperlipidemia) - Primary    Recheck FLP - pt states has been eating healthier - less fatty fried foods. Would be willing to consider retrial statin if LDL remaining elevated.      Relevant Orders   Lipid panel   Obesity, Class I, BMI 30-34.9    Phentermine ineffective for weight loss - predominant trouble with snacking at night time, phentermine taken during the day. Discussed options including saxenda, belviq, qsymia, contrave. Pt interested in trial of belviq. Coupon provided, Rx printed, RTC 3 mo. Discussed mechanism of action and common side effects to monitor.      Relevant Medications   Lorcaserin HCl (BELVIQ) 10 MG TABS       Follow up plan: Return in about 3 months (around 11/24/2015) for follow up visit.  Ria Bush, MD

## 2015-09-03 ENCOUNTER — Other Ambulatory Visit: Payer: Self-pay

## 2015-09-03 NOTE — Telephone Encounter (Signed)
Pt left v/m requesting refill valtrex to [facility]; last refilled # 31 x 12 on 03/14/2014. Last annual exam on 08/24/15.Please advise.

## 2015-09-04 MED ORDER — VALACYCLOVIR HCL 1 G PO TABS: 1000.0000 mg | ORAL_TABLET | Freq: Every day | ORAL | Status: DC

## 2015-09-09 ENCOUNTER — Encounter: Payer: Self-pay | Admitting: Family Medicine

## 2015-09-09 NOTE — Telephone Encounter (Signed)
Please see Mychart message.

## 2015-09-23 ENCOUNTER — Ambulatory Visit: Payer: 59 | Admitting: Obstetrics & Gynecology

## 2015-09-24 ENCOUNTER — Encounter: Payer: Self-pay | Admitting: Obstetrics & Gynecology

## 2015-09-24 ENCOUNTER — Ambulatory Visit (INDEPENDENT_AMBULATORY_CARE_PROVIDER_SITE_OTHER): Payer: 59 | Admitting: Obstetrics & Gynecology

## 2015-09-24 VITALS — BP 110/79 | HR 80 | Resp 16 | Ht 63.0 in | Wt 189.0 lb

## 2015-09-24 DIAGNOSIS — Z3009 Encounter for other general counseling and advice on contraception: Secondary | ICD-10-CM

## 2015-09-24 NOTE — Progress Notes (Signed)
   Subjective:    Patient ID: Colleen Boone, female    DOB: [birth year], 45 y.o.   MRN: [id number]  HPI 45 yo DW P1 (67 yo son) here today to discuss birth control options. She has been on depo provera for about 4 years. She doesn't have any periods. She had the Mirena for about a year but had it removed because she "could feel it". She wants to stop the depo due to weight gain. She was recently start on Belviq for weight loss.   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

## 2015-09-25 ENCOUNTER — Encounter (HOSPITAL_COMMUNITY): Payer: Self-pay | Admitting: *Deleted

## 2015-10-13 ENCOUNTER — Telehealth: Payer: Self-pay

## 2015-10-13 NOTE — Telephone Encounter (Signed)
Called patient to inform her of her financial responsibility for upcoming surgery, no answer, left voicemail instructing her to return my call here at the office.

## 2015-10-19 MED FILL — LOSARTAN-HCTZ 50-12.5 MG TA: 50-12.5 | 90 days supply | Qty: 90 | Fill #2

## 2015-11-24 ENCOUNTER — Encounter (HOSPITAL_COMMUNITY): Admission: RE | Payer: Self-pay | Source: Ambulatory Visit

## 2015-11-24 ENCOUNTER — Ambulatory Visit (HOSPITAL_COMMUNITY): Admission: RE | Admit: 2015-11-24 | Payer: 59 | Source: Ambulatory Visit | Admitting: Obstetrics & Gynecology

## 2015-11-24 SURGERY — SALPINGECTOMY, BILATERAL, LAPAROSCOPIC
Anesthesia: General | Laterality: Bilateral

## 2015-11-26 ENCOUNTER — Ambulatory Visit: Payer: 59 | Admitting: Family Medicine

## 2015-11-27 ENCOUNTER — Telehealth: Payer: Self-pay | Admitting: *Deleted

## 2015-11-27 NOTE — Telephone Encounter (Signed)
Called pt, no answer, left message to call the office.  

## 2015-11-27 NOTE — Telephone Encounter (Signed)
-----   Message from Francia Greaves sent at 11/26/2015  2:53 PM EDT ----- Regarding: Rx Request Contact: [phone] Wants to see if she can get an OCP rx so she can stop depo shots [facility] [address]

## 2015-12-01 ENCOUNTER — Other Ambulatory Visit: Payer: Self-pay | Admitting: Family Medicine

## 2015-12-02 ENCOUNTER — Telehealth: Payer: Self-pay | Admitting: *Deleted

## 2015-12-02 NOTE — Telephone Encounter (Signed)
-----   Message from Francia Greaves sent at 12/01/2015 10:28 AM EDT ----- Regarding: Returning Call Patient called, returning your call, states she will be working 3-3 tomorrow and she would rather you leave a detailed message

## 2015-12-02 NOTE — Telephone Encounter (Signed)
Spoke to pt, recommended to follow-up with Dr Hulan Fray at scheduled appt on 12-29-15 to discuss birth control options.  Pt acknowledged.

## 2015-12-29 ENCOUNTER — Ambulatory Visit: Payer: 59 | Admitting: Obstetrics & Gynecology

## 2016-01-14 MED FILL — LOSARTAN-HCTZ 50-12.5 MG TA: 50-12.5 | 90 days supply | Qty: 90 | Fill #3

## 2016-02-19 ENCOUNTER — Ambulatory Visit: Payer: 59 | Admitting: Obstetrics & Gynecology

## 2016-03-05 DIAGNOSIS — H524 Presbyopia: Secondary | ICD-10-CM | POA: Diagnosis not present

## 2016-03-15 ENCOUNTER — Encounter: Payer: Self-pay | Admitting: Obstetrics & Gynecology

## 2016-03-15 ENCOUNTER — Ambulatory Visit (INDEPENDENT_AMBULATORY_CARE_PROVIDER_SITE_OTHER): Payer: 59 | Admitting: Obstetrics & Gynecology

## 2016-03-15 ENCOUNTER — Encounter: Payer: Self-pay | Admitting: *Deleted

## 2016-03-15 VITALS — BP 122/85 | HR 91 | Ht 63.0 in | Wt 196.0 lb

## 2016-03-15 DIAGNOSIS — Z01419 Encounter for gynecological examination (general) (routine) without abnormal findings: Secondary | ICD-10-CM | POA: Diagnosis not present

## 2016-03-15 DIAGNOSIS — Z30430 Encounter for insertion of intrauterine contraceptive device: Secondary | ICD-10-CM | POA: Diagnosis not present

## 2016-03-15 DIAGNOSIS — Z0000 Encounter for general adult medical examination without abnormal findings: Secondary | ICD-10-CM

## 2016-03-15 DIAGNOSIS — N926 Irregular menstruation, unspecified: Secondary | ICD-10-CM

## 2016-03-15 MED ORDER — LEVONORGESTREL 20 MCG/24HR IU IUD
INTRAUTERINE_SYSTEM | Freq: Once | INTRAUTERINE | Status: AC
  Administered 2016-03-15: 09:00:00 via INTRAUTERINE

## 2016-03-15 NOTE — Progress Notes (Signed)
Subjective:    Colleen Boone is a 46 y.o. P56(19 yo son) female who presents for an annual exam. The patient has no complaints today.  The patient is not currently sexually active. GYN screening history: last pap: was normal. The patient wears seatbelts: yes. The patient participates in regular exercise: yes. Has the patient ever been transfused or tattooed?: yes. The patient reports that there is not domestic violence in her life.   Menstrual History: OB History    Gravida Para Term Preterm AB Living   2 1 1  0 1 1   SAB TAB Ectopic Multiple Live Births   0 1 0 0 1      Menarche age: 60 No LMP recorded. Patient has had an injection.    The following portions of the patient's history were reviewed and updated as appropriate: allergies, current medications, past family history, past medical history, past social history, past surgical history and problem list.  Review of Systems Pertinent items are noted in HPI.   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 No Fh- no breast/gyn, + colon cancer in [place]    Objective:    BP 122/85   Pulse 91   Ht 5\' 3"  (1.6 m)   Wt 196 lb (88.9 kg)   BMI 34.72 kg/m   General Appearance:    Alert, cooperative, no distress, appears stated age  Head:    Normocephalic, without obvious abnormality, atraumatic  Eyes:    PERRL, conjunctiva/corneas clear, EOM's intact, fundi    benign, both eyes  Ears:    Normal TM's and external ear canals, both ears  Nose:   Nares normal, septum midline, mucosa normal, no drainage    or sinus tenderness  Throat:   Lips, mucosa, and tongue normal; teeth and gums normal  Neck:   Supple, symmetrical, trachea midline, no adenopathy;    thyroid:  no enlargement/tenderness/nodules; no carotid   bruit or JVD  Back:     Symmetric, no curvature, ROM normal, no CVA tenderness  Lungs:     Clear to auscultation bilaterally, respirations unlabored  Chest Wall:    No tenderness or deformity   Heart:    Regular rate and rhythm,  S1 and S2 normal, no murmur, rub   or gallop  Breast Exam:    No tenderness, masses, or nipple abnormality  Abdomen:     Soft, non-tender, bowel sounds active all four quadrants,    no masses, no organomegaly  Genitalia:    Normal female without lesion, discharge or tenderness, NSSA, NT, mobile, no adnexal masses or tenderness. UPT negative, consent signed, Time out procedure done. Cervix prepped with betadine and grasped with a single tooth tenaculum. Mirena was easily placed and the strings were cut to 3-4 cm. Uterus sounded to 9 cm. She tolerated the procedure well.       Extremities:   Extremities normal, atraumatic, no cyanosis or edema  Pulses:   2+ and symmetric all extremities  Skin:   Skin color, texture, turgor normal, no rashes or lesions  Lymph nodes:   Cervical, supraclavicular, and axillary nodes normal  Neurologic:   CNII-XII intact, normal strength, sensation and reflexes    throughout   .    Assessment:    Healthy female exam.    Plan:     Mammogram. Thin prep Pap smear. with cotesting ( aware of ACOG recs) Mirena today

## 2016-03-15 NOTE — Progress Notes (Signed)
gyn

## 2016-03-16 LAB — CYTOLOGY - PAP
DIAGNOSIS: NEGATIVE
HPV (WINDOPATH): NOT DETECTED

## 2016-03-17 ENCOUNTER — Other Ambulatory Visit: Payer: Self-pay | Admitting: Obstetrics & Gynecology

## 2016-04-26 DIAGNOSIS — I8393 Asymptomatic varicose veins of bilateral lower extremities: Secondary | ICD-10-CM | POA: Diagnosis not present

## 2016-04-26 DIAGNOSIS — L821 Other seborrheic keratosis: Secondary | ICD-10-CM | POA: Diagnosis not present

## 2016-04-26 DIAGNOSIS — L718 Other rosacea: Secondary | ICD-10-CM | POA: Diagnosis not present

## 2016-04-26 DIAGNOSIS — L300 Nummular dermatitis: Secondary | ICD-10-CM | POA: Diagnosis not present

## 2016-04-26 DIAGNOSIS — L859 Epidermal thickening, unspecified: Secondary | ICD-10-CM | POA: Diagnosis not present

## 2016-04-26 DIAGNOSIS — Z1283 Encounter for screening for malignant neoplasm of skin: Secondary | ICD-10-CM | POA: Diagnosis not present

## 2016-04-26 DIAGNOSIS — I781 Nevus, non-neoplastic: Secondary | ICD-10-CM | POA: Diagnosis not present

## 2016-04-26 DIAGNOSIS — D1800 Hemangioma unspecified site: Secondary | ICD-10-CM | POA: Diagnosis not present

## 2016-04-26 DIAGNOSIS — D229 Melanocytic nevi, unspecified: Secondary | ICD-10-CM | POA: Diagnosis not present

## 2016-05-03 ENCOUNTER — Telehealth: Payer: Self-pay | Admitting: *Deleted

## 2016-05-03 NOTE — Telephone Encounter (Signed)
Spoke to Dr Hulan Fray, recommended pt start Lexapro 20mg  nightly and schedule an appointment in 3 months for a follow-up.  Called pt, no answer, left message.

## 2016-05-03 NOTE — Telephone Encounter (Signed)
-----   Message from Blanchie Dessert, [place] sent at 05/03/2016  8:40 AM EST ----- Regarding: Rx request Contact: [phone] Pt requesting something for anxiety and depression [facility] [address]  DOB Apr 16, 2070

## 2016-05-04 ENCOUNTER — Telehealth: Payer: Self-pay | Admitting: *Deleted

## 2016-05-04 DIAGNOSIS — F329 Major depressive disorder, single episode, unspecified: Secondary | ICD-10-CM

## 2016-05-04 DIAGNOSIS — F419 Anxiety disorder, unspecified: Secondary | ICD-10-CM

## 2016-05-04 DIAGNOSIS — F32A Depression, unspecified: Secondary | ICD-10-CM

## 2016-05-04 MED ORDER — ESCITALOPRAM OXALATE 10 MG PO TABS: 10.0000 mg | ORAL_TABLET | Freq: Every day | ORAL | 1 refills | Status: DC

## 2016-05-04 NOTE — Telephone Encounter (Signed)
-----   Message from Blanchie Dessert, [place] sent at 05/03/2016  8:40 AM EST ----- Regarding: Rx request Contact: [phone] Pt requesting something for anxiety and depression [facility] [address]  DOB [birth year]

## 2016-05-04 NOTE — Telephone Encounter (Signed)
Spoke to Dr Hulan Fray, stated pt could start Lexapro and follow-up with her in 3 months in the office to see how the medication is doing at that time.  [facility]  Informed pt that medication had been sent to pharmacy.

## 2016-05-05 ENCOUNTER — Other Ambulatory Visit: Payer: Self-pay | Admitting: *Deleted

## 2016-05-05 MED ORDER — LOSARTAN POTASSIUM-HCTZ 50-12.5 MG PO TABS
1.0000 | ORAL_TABLET | Freq: Every day | ORAL | 0 refills | Status: DC
Start: 2016-05-05 — End: 2016-08-10

## 2016-05-06 MED FILL — LOSARTAN-HCTZ 50-12.5 MG TA: 50-12.5 | 90 days supply | Qty: 90 | Fill #0

## 2016-06-06 MED FILL — ESCITALOPRAM 10 MG TABLET: 10 | 90 days supply | Qty: 90 | Fill #0

## 2016-08-10 ENCOUNTER — Other Ambulatory Visit: Payer: Self-pay | Admitting: *Deleted

## 2016-08-10 MED ORDER — LOSARTAN POTASSIUM-HCTZ 50-12.5 MG PO TABS: 1.0000 | ORAL_TABLET | Freq: Every day | ORAL | 0 refills | Status: DC

## 2016-08-10 MED FILL — LOSARTAN-HCTZ 50-12.5 MG TA: 50-12.5 | 90 days supply | Qty: 90 | Fill #0

## 2016-08-16 DIAGNOSIS — D229 Melanocytic nevi, unspecified: Secondary | ICD-10-CM | POA: Diagnosis not present

## 2016-08-16 DIAGNOSIS — L821 Other seborrheic keratosis: Secondary | ICD-10-CM | POA: Diagnosis not present

## 2016-08-16 DIAGNOSIS — I8393 Asymptomatic varicose veins of bilateral lower extremities: Secondary | ICD-10-CM | POA: Diagnosis not present

## 2016-08-16 DIAGNOSIS — D1800 Hemangioma unspecified site: Secondary | ICD-10-CM | POA: Diagnosis not present

## 2016-08-16 DIAGNOSIS — I781 Nevus, non-neoplastic: Secondary | ICD-10-CM | POA: Diagnosis not present

## 2016-10-21 ENCOUNTER — Other Ambulatory Visit: Payer: Self-pay | Admitting: Obstetrics & Gynecology

## 2016-10-21 ENCOUNTER — Encounter: Payer: Self-pay | Admitting: Obstetrics & Gynecology

## 2016-10-21 ENCOUNTER — Ambulatory Visit (INDEPENDENT_AMBULATORY_CARE_PROVIDER_SITE_OTHER): Payer: 59 | Admitting: Obstetrics & Gynecology

## 2016-10-21 VITALS — BP 125/86 | HR 92 | Wt 214.0 lb

## 2016-10-21 DIAGNOSIS — Z3009 Encounter for other general counseling and advice on contraception: Secondary | ICD-10-CM

## 2016-10-21 DIAGNOSIS — Z0000 Encounter for general adult medical examination without abnormal findings: Secondary | ICD-10-CM | POA: Diagnosis not present

## 2016-10-21 DIAGNOSIS — R635 Abnormal weight gain: Secondary | ICD-10-CM | POA: Diagnosis not present

## 2016-10-21 DIAGNOSIS — Z1231 Encounter for screening mammogram for malignant neoplasm of breast: Secondary | ICD-10-CM

## 2016-10-21 MED ORDER — FLUOXETINE HCL 20 MG PO TABS: 20.0000 mg | ORAL_TABLET | Freq: Every day | ORAL | 6 refills | Status: DC

## 2016-10-21 NOTE — Progress Notes (Signed)
   Subjective:    Patient ID: AVIAH SORCI, female    DOB: 03-27-1970, 46 y.o.   MRN: [id number]  HPI 46 yo lady here today to discuss a 20p# wieght gain in the last 7 months. Her mom died 5 months ago and she is dealing with the finances of her demented stepdad. LOTS of stress. She got the Mirena in January and is worried that Mirena may be causing the weight gain. She has only irregular spotting, no periods.  She was previously on lexapro for anxiety but stopped it when she felt like she didn't need it any longer.   Review of Systems     Objective:   Physical Exam  Breathing, conversing, and ambulating normally Well nourished, well hydrated white female, no apparent distress       Assessment & Plan:  Weight gain- check TSH and fasting labs today I suspect that the Mirena is NOT the cause I lean more towards stress and middle age. Trial of prozac 20 mg q AM RTC 4 weeks If no improvement in weight, then refer

## 2016-10-22 LAB — CBC
HEMOGLOBIN: 12.1 g/dL (ref 11.1–15.9)
Hematocrit: 38.7 % (ref 34.0–46.6)
MCH: 28.1 pg (ref 26.6–33.0)
MCHC: 31.3 g/dL — ABNORMAL LOW (ref 31.5–35.7)
MCV: 90 fL (ref 79–97)
Platelets: 248 10*3/uL (ref 150–379)
RBC: 4.3 x10E6/uL (ref 3.77–5.28)
RDW: 14.5 % (ref 12.3–15.4)
WBC: 7.7 10*3/uL (ref 3.4–10.8)

## 2016-10-22 LAB — COMPREHENSIVE METABOLIC PANEL
ALT: 54 IU/L — AB (ref 0–32)
AST: 38 IU/L (ref 0–40)
Albumin/Globulin Ratio: 2 (ref 1.2–2.2)
Albumin: 4.1 g/dL (ref 3.5–5.5)
Alkaline Phosphatase: 84 IU/L (ref 39–117)
BILIRUBIN TOTAL: 0.4 mg/dL (ref 0.0–1.2)
BUN/Creatinine Ratio: 15 (ref 9–23)
BUN: 12 mg/dL (ref 6–24)
CALCIUM: 9.1 mg/dL (ref 8.7–10.2)
CHLORIDE: 98 mmol/L (ref 96–106)
CO2: 22 mmol/L (ref 20–29)
Creatinine, Ser: 0.78 mg/dL (ref 0.57–1.00)
GFR calc non Af Amer: 91 mL/min/{1.73_m2} (ref >59)
GFR, EST AFRICAN AMERICAN: 105 mL/min/{1.73_m2} (ref >59)
GLOBULIN, TOTAL: 2.1 g/dL (ref 1.5–4.5)
Glucose: 77 mg/dL (ref 65–99)
POTASSIUM: 3.4 mmol/L — AB (ref 3.5–5.2)
SODIUM: 142 mmol/L (ref 134–144)
Total Protein: 6.2 g/dL (ref 6.0–8.5)

## 2016-10-22 LAB — TSH: TSH: 3.58 u[IU]/mL (ref 0.450–4.500)

## 2016-10-22 LAB — HEPATITIS C ANTIBODY

## 2016-10-22 LAB — LIPID PANEL
CHOL/HDL RATIO: 5.6 ratio — AB (ref 0.0–4.4)
Cholesterol, Total: 224 mg/dL — ABNORMAL HIGH (ref 100–199)
HDL: 40 mg/dL (ref >39)
LDL CALC: 152 mg/dL — AB (ref 0–99)
Triglycerides: 160 mg/dL — ABNORMAL HIGH (ref 0–149)
VLDL Cholesterol Cal: 32 mg/dL (ref 5–40)

## 2016-10-22 LAB — HIV ANTIBODY (ROUTINE TESTING W REFLEX): HIV SCREEN 4TH GENERATION: NONREACTIVE

## 2016-10-22 LAB — HEPATITIS B SURFACE ANTIGEN: HEP B S AG: NEGATIVE

## 2016-10-22 LAB — RPR: RPR Ser Ql: NONREACTIVE

## 2016-10-27 ENCOUNTER — Telehealth: Payer: Self-pay | Admitting: Obstetrics & Gynecology

## 2016-10-27 NOTE — Telephone Encounter (Signed)
Patient called and left message wanting to get her lab results.  Attempted to reach patient, she was not in and her mailbox was full.  All labs have been released to [facility].  Her cholesterol level is elevated and she should follow up with her PCP to manage this.

## 2016-11-01 ENCOUNTER — Ambulatory Visit
Admission: RE | Admit: 2016-11-01 | Discharge: 2016-11-01 | Disposition: A | Discharge status: Home / Self Care | Payer: 59 | Source: Ambulatory Visit | Attending: Obstetrics & Gynecology | Admitting: Obstetrics & Gynecology

## 2016-11-01 DIAGNOSIS — Z1231 Encounter for screening mammogram for malignant neoplasm of breast: Secondary | ICD-10-CM

## 2016-11-16 ENCOUNTER — Other Ambulatory Visit: Payer: Self-pay | Admitting: Family Medicine

## 2016-11-18 MED FILL — FLUoxetine HCL 20 MG TABS: 20 | 30 days supply | Qty: 30 | Fill #0

## 2016-11-30 ENCOUNTER — Ambulatory Visit: Payer: 59 | Admitting: Obstetrics & Gynecology

## 2016-11-30 DIAGNOSIS — Z09 Encounter for follow-up examination after completed treatment for conditions other than malignant neoplasm: Secondary | ICD-10-CM

## 2017-02-02 ENCOUNTER — Encounter: Payer: Self-pay | Admitting: Radiology

## 2017-03-01 ENCOUNTER — Telehealth: Payer: Self-pay | Admitting: Family Medicine

## 2017-03-01 MED ORDER — LOSARTAN POTASSIUM-HCTZ 50-12.5 MG PO TABS: 1.0000 | ORAL_TABLET | Freq: Every day | ORAL | 1 refills | Status: DC

## 2017-03-01 MED FILL — LOSARTAN-HCTZ 50-12.5 MG TA: 50-12.5 | 30 days supply | Qty: 30 | Fill #0

## 2017-03-01 NOTE — Telephone Encounter (Signed)
Copied from [place] [id number]. Topic: Inquiry >> Mar 01, 2017 10:50 AM [name] wrote: Reason for CRM: pt is requesting to switch PCP from [name] to [name], contact physicians for a resolution

## 2017-03-01 NOTE — Telephone Encounter (Signed)
Last OV 08/2015. pls advise

## 2017-03-01 NOTE — Telephone Encounter (Signed)
Pt has appt scheduled on 03/08/17. Will the pt have to wait to be seen at appt, before Losartan can be refilled?

## 2017-03-01 NOTE — Telephone Encounter (Signed)
Copied from [place] [id number]. Topic: General - Other >> Mar 01, 2017 10:56 AM Darl Householder, RMA wrote: Reason for CRM: medication refill request for Losartan 50-12.5 mg to be sen to [facility] outpatient pharmacy

## 2017-03-01 NOTE — Telephone Encounter (Signed)
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.  Ok by me if ok by Anda Kraft.  I haven't seen here since 2017.

## 2017-03-01 NOTE — Telephone Encounter (Signed)
Refill x 1 month 

## 2017-03-02 NOTE — Telephone Encounter (Signed)
Noted  

## 2017-03-02 NOTE — Telephone Encounter (Signed)
Okay to switch?  

## 2017-03-02 NOTE — Telephone Encounter (Signed)
I spoke to patient and let her know that she can switch to [facility].  Patient wanted to let Dr.G know that she really liked him, but she felt more comfortable with Anda Kraft. Patient wanted me to tell Dr.G thank you. Patient had appointment scheduled with Anda Kraft on 03/08/17.  I changed the appointment from 15 minutes to 30 minutes.

## 2017-03-08 ENCOUNTER — Encounter: Payer: Self-pay | Admitting: Primary Care

## 2017-03-08 ENCOUNTER — Ambulatory Visit: Payer: 59 | Admitting: Primary Care

## 2017-03-08 VITALS — BP 122/78 | HR 75 | Temp 98.6°F | Ht 63.0 in | Wt 214.1 lb

## 2017-03-08 DIAGNOSIS — E669 Obesity, unspecified: Secondary | ICD-10-CM

## 2017-03-08 DIAGNOSIS — E785 Hyperlipidemia, unspecified: Secondary | ICD-10-CM | POA: Diagnosis not present

## 2017-03-08 DIAGNOSIS — I10 Essential (primary) hypertension: Secondary | ICD-10-CM | POA: Diagnosis not present

## 2017-03-08 DIAGNOSIS — R748 Abnormal levels of other serum enzymes: Secondary | ICD-10-CM

## 2017-03-08 DIAGNOSIS — F411 Generalized anxiety disorder: Secondary | ICD-10-CM | POA: Diagnosis not present

## 2017-03-08 DIAGNOSIS — Z8249 Family history of ischemic heart disease and other diseases of the circulatory system: Secondary | ICD-10-CM

## 2017-03-08 DIAGNOSIS — R945 Abnormal results of liver function studies: Secondary | ICD-10-CM | POA: Diagnosis not present

## 2017-03-08 DIAGNOSIS — R7989 Other specified abnormal findings of blood chemistry: Secondary | ICD-10-CM

## 2017-03-08 LAB — HEPATIC FUNCTION PANEL
ALT: 73 U/L — ABNORMAL HIGH (ref 0–35)
AST: 52 U/L — AB (ref 0–37)
Albumin: 4.1 g/dL (ref 3.5–5.2)
Alkaline Phosphatase: 81 U/L (ref 39–117)
Bilirubin, Direct: 0.1 mg/dL (ref 0.0–0.3)
TOTAL PROTEIN: 6.7 g/dL (ref 6.0–8.3)
Total Bilirubin: 0.7 mg/dL (ref 0.2–1.2)

## 2017-03-08 LAB — LIPID PANEL
CHOLESTEROL: 229 mg/dL — AB (ref 0–200)
HDL: 37 mg/dL — ABNORMAL LOW (ref >39.00)
NonHDL: 192.35
TRIGLYCERIDES: 201 mg/dL — AB (ref 0.0–149.0)
Total CHOL/HDL Ratio: 6
VLDL: 40.2 mg/dL — ABNORMAL HIGH (ref 0.0–40.0)

## 2017-03-08 LAB — LDL CHOLESTEROL, DIRECT: Direct LDL: 192 mg/dL

## 2017-03-08 MED ORDER — BUPROPION HCL ER (XL) 150 MG PO TB24: 150.0000 mg | ORAL_TABLET | Freq: Every day | ORAL | 1 refills | Status: DC

## 2017-03-08 MED FILL — BUPROPION HCL XL 150 MG TAB: 150 | 30 days supply | Qty: 30 | Fill #0

## 2017-03-08 NOTE — Assessment & Plan Note (Signed)
Stable in the office today, continue losartan-HCTZ. Recent BMP unremarkable.

## 2017-03-08 NOTE — Patient Instructions (Addendum)
Start bupropion XL 150 mg tablets for anxiety and depression. Take 1 tablet by mouth every morning.   Please try to give this several weeks to take effect.  Please message me if you have any problems.  You will be contacted regarding your referral to cardiology.  Please let us know if you have not been contacted within one week.   Stop by the lab prior to leaving today. I will notify you of your results once received.   It was a pleasure to see you today!

## 2017-03-08 NOTE — Assessment & Plan Note (Signed)
Gradual weight gain over the past one year, increased stress. Suspect weight gain causing lower extremity edema. Suspect deconditioning from weight gain causing exertional shortness of breath.   Will send to cardiology for evaluation and potential stress test given family history of early cardiac death.  Strongly encouraged she work to improve her diet, also to start exercising.

## 2017-03-08 NOTE — Assessment & Plan Note (Signed)
Referral placed to cardiology for evaluation and potential stress test. Suspect exertional SOB from deconditioning secondary to weight gain, but given family history of early cardiac death, will have evaluated.  No chest pain, palpitations, alarm signs.

## 2017-03-08 NOTE — Progress Notes (Signed)
Subjective:    Patient ID: Colleen Boone, female    DOB: 1970-12-14, 47 y.o.   MRN: [id number]  HPI  Colleen Boone is a 47 year old female who presents today to transfer care and for a chief complaint of lower extremity edema and shortness of breath.  1) Essential Hypertension: Currently managed on losartan-HCTZ 50-12.5 mg. She denies chest pain, headaches, dizziness. Strong family history of CAD with early death <50 in her father.   BP Readings from Last 3 Encounters:  03/08/17 122/78  10/21/16 125/86  03/15/16 122/85     2) Lower Extremity Edema: Long history of since the birth of her son 20 years ago. Over the last several months she's noticed increased edema. She endorses weight gain gradually over the past one year. She's been under a lot of stress given her mother's chronic illness and her passing in March 2018.   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 Cold sores occur infrequently, only with stress.   4) Obesity: Previously seeing a weight loss clinic, once managed on Adipex and most recently on Belviq. She stopped taking Belviq as it caused her to feel drowsy. Also on HCG injections.   She endorses a poor diet. Diet currently consists of:  Breakfast: Skips Lunch: Salad, sandwich Dinner: Pasta, pizza Snacks: Chocolate, chips and salsa, nuts Desserts: Daily Beverages: Coffee, sparkling water, wine  Exercise: She is not currently exercising  5) Anxiety and Depression: Long history of anxiety, recent depression over the past one year given her mother's illness and recent passing. Previously managed on Lexapro, Prozac, and recently Belviq. She didn't like the way she felt on Lexapro and Prozac, Belviq caused her to feel drowsy. She is interesting in trying something new given daily symptoms of anxiety.  GAD 7 scor of 12.    Review of Systems  Respiratory:       Intermittent exertional shortness of breath  Cardiovascular: Positive  for leg swelling. Negative for chest pain and palpitations.  Neurological: Negative for dizziness, numbness and headaches.  Psychiatric/Behavioral:       See HPI       Past Medical History:  Diagnosis Date  . Abnormal Pap smear of cervix   . Accessory ureter   . Anxiety    tapered off sertraline, stable.  . Fibroids 12/06/2011   Ultrasound showed: 8.8 x 4.6 x 5.6 cm uterus. 2.0 cm intramural fibroid in the uterine fundus, second intramural 1.7 cm fibroid in the right anterior upper corpus. EM 5 mm, no focal lesion. Normal ovaries.   Marland Kitchen GERD (gastroesophageal reflux disease)   . Hay fever   . Herpes labialis    acid foods and stress incite  . History of gestational diabetes   . HLD (hyperlipidemia)   . HTN (hypertension)   . Obesity   . Uterine polyp   . Vaginal cyst      Social History   Socioeconomic History  . Marital status: Single    Spouse name: Not on file  . Number of children: Not on file  . Years of education: Not on file  . Highest education level: Not on file  Social Needs  . Financial resource strain: Not on file  . Food insecurity - worry: Not on file  . Food insecurity - inability: Not on file  . Transportation needs - medical: Not on file  . Transportation needs - non-medical: Not on file  Occupational History  . Not  on file  Tobacco Use  . Smoking status: Never Smoker  . Smokeless tobacco: Never Used  Substance and Sexual Activity  . Alcohol use: Yes    Comment: 2 glasses red wine nightly  . Drug use: No  . Sexual activity: Not Currently    Partners: Male    Birth control/protection: Condom, Injection    Comment: depo povera  Other Topics Concern  . Not on file  Social History Narrative   Caffeine: 1 cup coffee/day   Lives with son (1998), 2 dogs   Occupation: Therapist, sports at [employer], working on [employer]   Activity: gym 2-3x/wk, cardio, back in school   Diet: not lots of fruits.  Some water.    Past Surgical History:  Procedure Laterality Date    . DILATION AND CURETTAGE OF UTERUS  2007  . TONSILLECTOMY  1975  . WISDOM TOOTH EXTRACTION      x4    Family History  Problem Relation Age of Onset  . Hypertension Mother   . Diabetes Mother   . Other Mother        NASH with varices  . Liver disease Mother        NASH, ITP  . Coronary artery disease Father 70       massive MI  . Cancer Father 82       lymphoma  . Hypertension Father   . Hyperlipidemia Father   . Diabetes Father   . Diabetes Brother   . Cancer Maternal Uncle 30       colon  . Stroke Maternal Grandmother        post card cath  . Cancer Maternal Grandmother        leukemia and polycythemia  . Hypertension Maternal Grandmother   . Hypertension Maternal Grandfather   . Stroke Paternal Grandmother   . Hypertension Paternal Grandmother   . Stroke Paternal Grandfather   . Hypertension Paternal Grandfather   . Cancer Maternal Uncle        bladder  . Deep vein thrombosis Brother        right arm  . Breast cancer Neg Hx     Allergies  Allergen Reactions  . Latex Rash  . Aleve [Naproxen Sodium]     Hot flashes  . Simvastatin Other (See Comments)    fatigue    Current Outpatient Medications on File Prior to Visit  Medication Sig Dispense Refill  . esomeprazole (NEXIUM) 20 MG capsule Take 20 mg by mouth daily at 12 noon.    Marland Kitchen levonorgestrel (MIRENA) 20 MCG/24HR IUD 1 each by Intrauterine route once. Inserted 03/2016    . losartan-hydrochlorothiazide (HYZAAR) 50-12.5 MG tablet Take 1 tablet by mouth daily. 30 tablet 1  . valACYclovir (VALTREX) 1000 MG tablet TAKE 1 TABLET (1,000 MG TOTAL) BY MOUTH DAILY. 31 tablet 0  . Acetaminophen (TYLENOL) 325 MG CAPS     . human chorionic gonadotropin (PREGNYL/NOVAREL) 10000 units injection Inject 36 Units as directed every 7 (seven) weeks.    . Ibuprofen 200 MG CAPS     . Melatonin 5 MG CHEW      No current facility-administered medications on file prior to visit.     BP 122/78   Pulse 75   Temp 98.6 F (37  C) (Oral)   Ht 5\' 3"  (1.6 m)   Wt 214 lb 1.9 oz (97.1 kg)   SpO2 97%   BMI 37.93 kg/m    Objective:   Physical Exam  Constitutional:  She appears well-nourished.  Neck: Neck supple.  Cardiovascular: Normal rate and regular rhythm.  Pulses:      Posterior tibial pulses are 2+ on the right side, and 2+ on the left side.  Trace lower extremity edema noted bilaterally. No pitting.  Pulmonary/Chest: Effort normal and breath sounds normal.  Skin: Skin is warm and dry.          Assessment & Plan:

## 2017-03-08 NOTE — Assessment & Plan Note (Signed)
Repeat lipids today. Discussed the importance of a healthy diet and regular exercise in order for weight loss, and to reduce the risk of any potential medical problems.

## 2017-03-08 NOTE — Assessment & Plan Note (Signed)
Also with some depression. GAD 7 score of 12 today. Discussed options, would avoid anything to cause weight gain. She's failed two SSRI's. Will trial low dose Wellbutrin, she will update in 4 weeks.

## 2017-03-14 ENCOUNTER — Encounter: Payer: Self-pay | Admitting: Primary Care

## 2017-03-14 ENCOUNTER — Ambulatory Visit (HOSPITAL_COMMUNITY)
Admission: RE | Admit: 2017-03-14 | Discharge: 2017-03-14 | Disposition: A | Discharge status: Home / Self Care | Payer: 59 | Source: Ambulatory Visit | Attending: Primary Care | Admitting: Primary Care

## 2017-03-14 DIAGNOSIS — R748 Abnormal levels of other serum enzymes: Secondary | ICD-10-CM | POA: Diagnosis not present

## 2017-03-14 DIAGNOSIS — R932 Abnormal findings on diagnostic imaging of liver and biliary tract: Secondary | ICD-10-CM | POA: Insufficient documentation

## 2017-03-14 DIAGNOSIS — R945 Abnormal results of liver function studies: Secondary | ICD-10-CM

## 2017-03-15 ENCOUNTER — Telehealth: Payer: Self-pay

## 2017-03-15 NOTE — Telephone Encounter (Signed)
Spoke with patient regarding concerns. 

## 2017-03-15 NOTE — Telephone Encounter (Signed)
Copied from [place] [id number]. Topic: Referral - Question >> Mar 15, 2017  2:53 PM Ivar Drape wrote: Reason for CRM: Patient needs to ask the PCP a question about the referral that was put in for her.    >> Mar 15, 2017  3:20 PM Helene Shoe, LPN wrote: I spoke with pt and she has already sent an email to Allie Bossier NP and request cb from Allie Bossier NP before she leaves office today; pt has questions before getting MRI. Pt preferred to talk with Gentry Fitz NP.

## 2017-03-17 ENCOUNTER — Encounter: Payer: Self-pay | Admitting: Primary Care

## 2017-03-17 ENCOUNTER — Ambulatory Visit (HOSPITAL_COMMUNITY)
Admission: RE | Admit: 2017-03-17 | Discharge: 2017-03-17 | Disposition: A | Discharge status: Home / Self Care | Payer: 59 | Source: Ambulatory Visit | Attending: Primary Care | Admitting: Primary Care

## 2017-03-17 DIAGNOSIS — R945 Abnormal results of liver function studies: Secondary | ICD-10-CM | POA: Diagnosis not present

## 2017-03-17 DIAGNOSIS — K760 Fatty (change of) liver, not elsewhere classified: Secondary | ICD-10-CM | POA: Diagnosis not present

## 2017-03-17 DIAGNOSIS — R932 Abnormal findings on diagnostic imaging of liver and biliary tract: Secondary | ICD-10-CM | POA: Diagnosis not present

## 2017-03-17 LAB — POCT I-STAT CREATININE: Creatinine, Ser: 0.8 mg/dL (ref 0.44–1.00)

## 2017-03-17 MED ORDER — GADOBENATE DIMEGLUMINE 529 MG/ML IV SOLN
20.0000 mL | Freq: Once | INTRAVENOUS | Status: AC | PRN
  Administered 2017-03-17: 20 mL via INTRAVENOUS

## 2017-03-29 MED FILL — LOSARTAN-HCTZ 50-12.5 MG TA: 50-12.5 | 30 days supply | Qty: 30 | Fill #1

## 2017-04-12 ENCOUNTER — Ambulatory Visit: Payer: 59 | Admitting: Cardiovascular Disease

## 2017-04-28 ENCOUNTER — Other Ambulatory Visit: Payer: Self-pay | Admitting: Family Medicine

## 2017-04-28 MED FILL — LOSARTAN-HCTZ 50-12.5 MG TA: 50-12.5 | 90 days supply | Qty: 90 | Fill #0

## 2017-05-15 ENCOUNTER — Ambulatory Visit: Payer: 59 | Admitting: Cardiovascular Disease

## 2017-08-09 MED FILL — LOSARTAN-HCTZ 50-12.5 MG TA: 50-12.5 | 90 days supply | Qty: 90 | Fill #1

## 2017-09-22 DIAGNOSIS — H6121 Impacted cerumen, right ear: Secondary | ICD-10-CM | POA: Diagnosis not present

## 2017-09-22 DIAGNOSIS — M26609 Unspecified temporomandibular joint disorder, unspecified side: Secondary | ICD-10-CM | POA: Diagnosis not present

## 2017-09-22 DIAGNOSIS — H905 Unspecified sensorineural hearing loss: Secondary | ICD-10-CM | POA: Diagnosis not present

## 2017-09-22 DIAGNOSIS — H6980 Other specified disorders of Eustachian tube, unspecified ear: Secondary | ICD-10-CM | POA: Diagnosis not present

## 2017-09-22 DIAGNOSIS — I10 Essential (primary) hypertension: Secondary | ICD-10-CM | POA: Diagnosis not present

## 2017-09-22 MED FILL — predniSONE 10 MG TABS: 10 | 12 days supply | Qty: 48 | Fill #0

## 2017-11-13 ENCOUNTER — Other Ambulatory Visit: Payer: Self-pay | Admitting: Primary Care

## 2017-11-13 DIAGNOSIS — I10 Essential (primary) hypertension: Secondary | ICD-10-CM

## 2017-11-13 NOTE — Telephone Encounter (Signed)
Noted, refill sent to pharmacy. 

## 2017-11-13 NOTE — Telephone Encounter (Signed)
Electronic refill request Last office visit 03/08/17 Last refill 04/28/17 #90/1 Last lab work 03/08/17 (see notes)

## 2017-11-14 MED FILL — LOSARTAN-HCTZ 50-12.5 MG TA: 50-12.5 | 90 days supply | Qty: 90 | Fill #0

## 2018-02-14 ENCOUNTER — Other Ambulatory Visit: Payer: Self-pay | Admitting: Primary Care

## 2018-02-14 MED ORDER — HYDROCHLOROTHIAZIDE 12.5 MG PO TABS: 12.5000 mg | ORAL_TABLET | Freq: Every day | ORAL | 0 refills | Status: DC

## 2018-02-14 MED ORDER — LOSARTAN POTASSIUM 50 MG PO TABS: 50.0000 mg | ORAL_TABLET | Freq: Every day | ORAL | 0 refills | Status: DC

## 2018-02-14 MED FILL — HYDROCHLOROTHIAZIDE 12.5 MG: 12.5 | 90 days supply | Qty: 90 | Fill #0

## 2018-02-14 MED FILL — LOSARTAN POTASSIUM 50 MG TA: 50 | 90 days supply | Qty: 90 | Fill #0

## 2018-02-14 NOTE — Telephone Encounter (Signed)
Received faxed refill request from [facility] stating   losartan-hydrochlorothiazide (HYZAAR) 50-12.5 MG tablet is on back order.   Will send separate Rx as requested.

## 2018-05-07 ENCOUNTER — Other Ambulatory Visit: Payer: Self-pay | Admitting: Primary Care

## 2018-05-09 MED FILL — HYDROCHLOROTHIAZIDE 12.5 MG: 12.5 | 30 days supply | Qty: 30 | Fill #0

## 2018-05-14 ENCOUNTER — Encounter: Payer: Self-pay | Admitting: Primary Care

## 2018-05-14 ENCOUNTER — Ambulatory Visit (INDEPENDENT_AMBULATORY_CARE_PROVIDER_SITE_OTHER): Payer: 59 | Admitting: Primary Care

## 2018-05-14 VITALS — BP 126/84 | HR 86 | Temp 98.1°F | Ht 63.0 in | Wt 221.2 lb

## 2018-05-14 DIAGNOSIS — K219 Gastro-esophageal reflux disease without esophagitis: Secondary | ICD-10-CM | POA: Diagnosis not present

## 2018-05-14 DIAGNOSIS — R7303 Prediabetes: Secondary | ICD-10-CM

## 2018-05-14 DIAGNOSIS — K625 Hemorrhage of anus and rectum: Secondary | ICD-10-CM | POA: Diagnosis not present

## 2018-05-14 DIAGNOSIS — I10 Essential (primary) hypertension: Secondary | ICD-10-CM

## 2018-05-14 DIAGNOSIS — R87810 Cervical high risk human papillomavirus (HPV) DNA test positive: Secondary | ICD-10-CM

## 2018-05-14 DIAGNOSIS — Z0000 Encounter for general adult medical examination without abnormal findings: Secondary | ICD-10-CM

## 2018-05-14 DIAGNOSIS — F411 Generalized anxiety disorder: Secondary | ICD-10-CM | POA: Diagnosis not present

## 2018-05-14 DIAGNOSIS — E669 Obesity, unspecified: Secondary | ICD-10-CM | POA: Diagnosis not present

## 2018-05-14 DIAGNOSIS — Z1239 Encounter for other screening for malignant neoplasm of breast: Secondary | ICD-10-CM | POA: Diagnosis not present

## 2018-05-14 DIAGNOSIS — E785 Hyperlipidemia, unspecified: Secondary | ICD-10-CM

## 2018-05-14 DIAGNOSIS — R87610 Atypical squamous cells of undetermined significance on cytologic smear of cervix (ASC-US): Secondary | ICD-10-CM | POA: Diagnosis not present

## 2018-05-14 LAB — COMPREHENSIVE METABOLIC PANEL
ALT: 56 U/L — ABNORMAL HIGH (ref 0–35)
AST: 44 U/L — ABNORMAL HIGH (ref 0–37)
Albumin: 4.2 g/dL (ref 3.5–5.2)
Alkaline Phosphatase: 72 U/L (ref 39–117)
BUN: 9 mg/dL (ref 6–23)
CO2: 26 meq/L (ref 19–32)
Calcium: 9.5 mg/dL (ref 8.4–10.5)
Chloride: 101 mEq/L (ref 96–112)
Creatinine, Ser: 0.73 mg/dL (ref 0.40–1.20)
GFR: 85.03 mL/min (ref >60.00)
GLUCOSE: 88 mg/dL (ref 70–99)
Potassium: 3.8 mEq/L (ref 3.5–5.1)
SODIUM: 137 meq/L (ref 135–145)
Total Bilirubin: 0.8 mg/dL (ref 0.2–1.2)
Total Protein: 6.9 g/dL (ref 6.0–8.3)

## 2018-05-14 LAB — LIPID PANEL
CHOL/HDL RATIO: 6
Cholesterol: 255 mg/dL — ABNORMAL HIGH (ref 0–200)
HDL: 41.8 mg/dL (ref >39.00)
NonHDL: 213.52
Triglycerides: 218 mg/dL — ABNORMAL HIGH (ref 0.0–149.0)
VLDL: 43.6 mg/dL — ABNORMAL HIGH (ref 0.0–40.0)

## 2018-05-14 LAB — HEMOGLOBIN A1C: HEMOGLOBIN A1C: 6.2 % (ref 4.6–6.5)

## 2018-05-14 LAB — LDL CHOLESTEROL, DIRECT: Direct LDL: 208 mg/dL

## 2018-05-14 MED ORDER — LOSARTAN POTASSIUM-HCTZ 50-12.5 MG PO TABS: ORAL_TABLET | ORAL | 3 refills | Status: DC

## 2018-05-14 NOTE — Assessment & Plan Note (Signed)
Following with GYN. Last pap smear unremarkable.

## 2018-05-14 NOTE — Assessment & Plan Note (Signed)
Present for one year, intermittent. Suspect hemorrhoid, she kindly declines rectal exam today. Referral placed to GI for further evaluation.

## 2018-05-14 NOTE — Progress Notes (Signed)
Subjective:    Patient ID: Colleen Boone, female    DOB: [birth year], 48 y.o.   MRN: [id number]  HPI  Colleen Boone is a 48 year old female who presents today for complete physical. She also endorses chronic rectal bleeding.  Immunizations: -Tetanus: Completed in 2013 -Influenza: Completed this season    Diet: She endorses a fair diet Breakfast: Skips Lunch: Salad, soup Dinner: Meat, pasta, potatoes, some vegetables  Snacks: Chips Desserts: None Beverages: Unsweet tea, some water, coffee, wine  Exercise: She is not exercising  Eye exam: Completed in 2020 Dental exam: Due soon. Pap Smear: Completed in 2018 Mammogram: Completed in 2018  BP Readings from Last 3 Encounters:  05/14/18 126/84  03/08/17 122/78  10/21/16 125/86   Wt Readings from Last 3 Encounters:  05/14/18 221 lb 4 oz (100.4 kg)  03/08/17 214 lb 1.9 oz (97.1 kg)  10/21/16 214 lb (97.1 kg)     Review of Systems  Constitutional: Negative for unexpected weight change.  HENT: Negative for rhinorrhea.   Respiratory: Negative for cough and shortness of breath.   Cardiovascular: Negative for chest pain.  Gastrointestinal: Negative for constipation and diarrhea.       Intermittent rectal bleeding once weekly with bowel movements. Feels hemorrhoid.   Genitourinary: Negative for difficulty urinating.       IUD in place, intermittent spotting  Musculoskeletal: Negative for arthralgias and myalgias.  Skin: Negative for rash.  Allergic/Immunologic: Negative for environmental allergies.  Neurological: Negative for dizziness and numbness.  Psychiatric/Behavioral: The patient is not nervous/anxious.        Doing well off of the Wellbutirn. No concerns.        Past Medical History:  Diagnosis Date  . Abnormal Pap smear of cervix   . Accessory ureter   . Anxiety    tapered off sertraline, stable.  . Fibroids 12/06/2011   Ultrasound showed: 8.8 x 4.6 x 5.6 cm uterus. 2.0 cm intramural fibroid in the uterine  fundus, second intramural 1.7 cm fibroid in the right anterior upper corpus. EM 5 mm, no focal lesion. Normal ovaries.   Marland Kitchen GERD (gastroesophageal reflux disease)   . Hay fever   . Herpes labialis    acid foods and stress incite  . History of gestational diabetes   . HLD (hyperlipidemia)   . HTN (hypertension)   . Obesity   . Uterine polyp   . Vaginal cyst      Social History   Socioeconomic History  . Marital status: Single    Spouse name: Not on file  . Number of children: Not on file  . Years of education: Not on file  . Highest education level: Not on file  Occupational History  . Not on file  Social Needs  . Financial resource strain: Not on file  . Food insecurity:    Worry: Not on file    Inability: Not on file  . Transportation needs:    Medical: Not on file    Non-medical: Not on file  Tobacco Use  . Smoking status: Never Smoker  . Smokeless tobacco: Never Used  Substance and Sexual Activity  . Alcohol use: Yes    Comment: 2 glasses red wine nightly  . Drug use: No  . Sexual activity: Not Currently    Partners: Male    Birth control/protection: Condom, Injection    Comment: depo povera  Lifestyle  . Physical activity:    Days per week: Not on file  Minutes per session: Not on file  . Stress: Not on file  Relationships  . Social connections:    Talks on phone: Not on file    Gets together: Not on file    Attends religious service: Not on file    Active member of club or organization: Not on file    Attends meetings of clubs or organizations: Not on file    Relationship status: Not on file  . Intimate partner violence:    Fear of current or ex partner: Not on file    Emotionally abused: Not on file    Physically abused: Not on file    Forced sexual activity: Not on file  Other Topics Concern  . Not on file  Social History Narrative   Caffeine: 1 cup coffee/day   Lives with son (1998), 2 dogs   Occupation: Therapist, sports at [employer], working on [employer]     Activity: gym 2-3x/wk, cardio, back in school   Diet: not lots of fruits.  Some water.    Past Surgical History:  Procedure Laterality Date  . DILATION AND CURETTAGE OF UTERUS  2007  . TONSILLECTOMY  1975  . WISDOM TOOTH EXTRACTION      x4    Family History  Problem Relation Age of Onset  . Hypertension Mother   . Diabetes Mother   . Other Mother        NASH with varices  . Liver disease Mother        NASH, ITP  . Coronary artery disease Father 70       massive MI  . Cancer Father 72       lymphoma  . Hypertension Father   . Hyperlipidemia Father   . Diabetes Father   . Diabetes Brother   . Cancer Maternal Uncle 10       colon  . Stroke Maternal Grandmother        post card cath  . Cancer Maternal Grandmother        leukemia and polycythemia  . Hypertension Maternal Grandmother   . Hypertension Maternal Grandfather   . Stroke Paternal Grandmother   . Hypertension Paternal Grandmother   . Stroke Paternal Grandfather   . Hypertension Paternal Grandfather   . Cancer Maternal Uncle        bladder  . Deep vein thrombosis Brother        right arm  . Breast cancer Neg Hx     Allergies  Allergen Reactions  . Latex Rash  . Aleve [Naproxen Sodium]     Hot flashes  . Simvastatin Other (See Comments)    fatigue    Current Outpatient Medications on File Prior to Visit  Medication Sig Dispense Refill  . Acetaminophen (TYLENOL) 325 MG CAPS     . esomeprazole (NEXIUM) 20 MG capsule Take 20 mg by mouth daily at 12 noon.    . Ibuprofen 200 MG CAPS     . levonorgestrel (MIRENA) 20 MCG/24HR IUD 1 each by Intrauterine route once. Inserted 03/2016    . Melatonin 5 MG CHEW     . valACYclovir (VALTREX) 1000 MG tablet TAKE 1 TABLET (1,000 MG TOTAL) BY MOUTH DAILY. 31 tablet 0   No current facility-administered medications on file prior to visit.     BP 126/84   Pulse 86   Temp 98.1 F (36.7 C) (Oral)   Ht 5\' 3"  (1.6 m)   Wt 221 lb 4 oz (100.4 kg)   SpO2  98%   BMI  39.19 kg/m    Objective:   Physical Exam  Constitutional: She is oriented to person, place, and time. She appears well-nourished.  HENT:  Mouth/Throat: No oropharyngeal exudate.  Eyes: Pupils are equal, round, and reactive to light. EOM are normal.  Neck: Neck supple. No thyromegaly present.  Cardiovascular: Normal rate and regular rhythm.  Respiratory: Effort normal and breath sounds normal.  GI: Soft. Bowel sounds are normal. There is no abdominal tenderness.  Declines rectal exam  Musculoskeletal: Normal range of motion.  Neurological: She is alert and oriented to person, place, and time.  Skin: Skin is warm and dry.  Psychiatric: She has a normal mood and affect.           Assessment & Plan:

## 2018-05-14 NOTE — Assessment & Plan Note (Addendum)
Immunizations UTD. Mammogram due, ordered. Pap smear UTD, follows with GYN. Recommended regular exercise, healthy diet. Exam unremarkable. Labs pending. Follow up in 1 year for CPE.

## 2018-05-14 NOTE — Patient Instructions (Signed)
Stop by the lab prior to leaving today. I will notify you of your results once received.   Start exercising. You should be getting 150 minutes of moderate intensity exercise weekly.  It's important to improve your diet by reducing consumption of fast food, fried food, processed snack foods, sugary drinks. Increase consumption of fresh vegetables and fruits, whole grains, water.  Ensure you are drinking 64 ounces of water daily.  You will be contacted regarding your referral to GI.  Please let us know if you have not been contacted within one week.   Schedule your mammogram.  It was a pleasure to see you today!   Preventive Care 40-64 Years, Female Preventive care refers to lifestyle choices and visits with your health care provider that can promote health and wellness. What does preventive care include?   A yearly physical exam. This is also called an annual well check.  Dental exams once or twice a year.  Routine eye exams. Ask your health care provider how often you should have your eyes checked.  Personal lifestyle choices, including: ? Daily care of your teeth and gums. ? Regular physical activity. ? Eating a healthy diet. ? Avoiding tobacco and drug use. ? Limiting alcohol use. ? Practicing safe sex. ? Taking low-dose aspirin daily starting at age 76. ? Taking vitamin and mineral supplements as recommended by your health care provider. What happens during an annual well check? The services and screenings done by your health care provider during your annual well check will depend on your age, overall health, lifestyle risk factors, and family history of disease. Counseling Your health care provider may ask you questions about your:  Alcohol use.  Tobacco use.  Drug use.  Emotional well-being.  Home and relationship well-being.  Sexual activity.  Eating habits.  Work and work Statistician.  Method of birth control.  Menstrual cycle.  Pregnancy  history. Screening You may have the following tests or measurements:  Height, weight, and BMI.  Blood pressure.  Lipid and cholesterol levels. These may be checked every 5 years, or more frequently if you are over 26 years old.  Skin check.  Lung cancer screening. You may have this screening every year starting at age 29 if you have a 30-pack-year history of smoking and currently smoke or have quit within the past 15 years.  Colorectal cancer screening. All adults should have this screening starting at age 87 and continuing until age 69. Your health care provider may recommend screening at age 18. You will have tests every 1-10 years, depending on your results and the type of screening test. People at increased risk should start screening at an earlier age. Screening tests may include: ? Guaiac-based fecal occult blood testing. ? Fecal immunochemical test (FIT). ? Stool DNA test. ? Virtual colonoscopy. ? Sigmoidoscopy. During this test, a flexible tube with a tiny camera (sigmoidoscope) is used to examine your rectum and lower colon. The sigmoidoscope is inserted through your anus into your rectum and lower colon. ? Colonoscopy. During this test, a long, thin, flexible tube with a tiny camera (colonoscope) is used to examine your entire colon and rectum.  Hepatitis C blood test.  Hepatitis B blood test.  Sexually transmitted disease (STD) testing.  Diabetes screening. This is done by checking your blood sugar (glucose) after you have not eaten for a while (fasting). You may have this done every 1-3 years.  Mammogram. This may be done every 1-2 years. Talk to your health care provider  about when you should start having regular mammograms. This may depend on whether you have a family history of breast cancer.  BRCA-related cancer screening. This may be done if you have a family history of breast, ovarian, tubal, or peritoneal cancers.  Pelvic exam and Pap test. This may be done every  3 years starting at age 65. Starting at age 32, this may be done every 5 years if you have a Pap test in combination with an HPV test.  Bone density scan. This is done to screen for osteoporosis. You may have this scan if you are at high risk for osteoporosis. Discuss your test results, treatment options, and if necessary, the need for more tests with your health care provider. Vaccines Your health care provider may recommend certain vaccines, such as:  Influenza vaccine. This is recommended every year.  Tetanus, diphtheria, and acellular pertussis (Tdap, Td) vaccine. You may need a Td booster every 10 years.  Varicella vaccine. You may need this if you have not been vaccinated.  Zoster vaccine. You may need this after age 84.  Measles, mumps, and rubella (MMR) vaccine. You may need at least one dose of MMR if you were born in 1957 or later. You may also need a second dose.  Pneumococcal 13-valent conjugate (PCV13) vaccine. You may need this if you have certain conditions and were not previously vaccinated.  Pneumococcal polysaccharide (PPSV23) vaccine. You may need one or two doses if you smoke cigarettes or if you have certain conditions.  Meningococcal vaccine. You may need this if you have certain conditions.  Hepatitis A vaccine. You may need this if you have certain conditions or if you travel or work in places where you may be exposed to hepatitis A.  Hepatitis B vaccine. You may need this if you have certain conditions or if you travel or work in places where you may be exposed to hepatitis B.  Haemophilus influenzae type b (Hib) vaccine. You may need this if you have certain conditions. Talk to your health care provider about which screenings and vaccines you need and how often you need them. This information is not intended to replace advice given to you by your health care provider. Make sure you discuss any questions you have with your health care provider. Document  Released: 03/20/2015 Document Revised: 04/13/2017 Document Reviewed: 12/23/2014 Elsevier Interactive Patient Education  2019 [employer].

## 2018-05-14 NOTE — Assessment & Plan Note (Signed)
Doing well on Nexium 20 mg, continue same.  

## 2018-05-14 NOTE — Assessment & Plan Note (Signed)
Repeat lipids pending.  

## 2018-05-14 NOTE — Assessment & Plan Note (Signed)
Repeat A1C pending.  Discussed the importance of a healthy diet and regular exercise in order for weight loss, and to reduce the risk of any potential medical problems.  

## 2018-05-14 NOTE — Assessment & Plan Note (Signed)
Overall stable off of Wellbutrin, continue same.  Continue to monitor.

## 2018-05-14 NOTE — Assessment & Plan Note (Signed)
Discussed the importance of a healthy diet and regular exercise in order for weight loss, and to reduce the risk of any potential medical problems.  

## 2018-05-15 MED ORDER — LOSARTAN POTASSIUM 50 MG PO TABS: 50.0000 mg | ORAL_TABLET | Freq: Every day | ORAL | 3 refills | Status: DC

## 2018-05-15 MED ORDER — HYDROCHLOROTHIAZIDE 12.5 MG PO TABS: 12.5000 mg | ORAL_TABLET | Freq: Every day | ORAL | 3 refills | Status: DC

## 2018-05-15 MED FILL — LOSARTAN POTASSIUM 50 MG TA: 50 | 90 days supply | Qty: 90 | Fill #0

## 2018-05-15 NOTE — Addendum Note (Signed)
Addended by: Jacqualin Combes on: 05/15/2018 08:49 AM   Modules accepted: Orders

## 2018-05-15 NOTE — Addendum Note (Signed)
Addended by: Jacqualin Combes on: 05/15/2018 11:44 AM   Modules accepted: Orders

## 2018-05-31 MED FILL — HYDROCHLOROTHIAZIDE 12.5 MG: 12.5 | 90 days supply | Qty: 90 | Fill #0

## 2018-06-19 ENCOUNTER — Telehealth: Payer: Self-pay | Admitting: Radiology

## 2018-06-19 NOTE — Telephone Encounter (Signed)
Left message on voicemail of cancellation for Annual Exam on 06/26/18 with Dr Hulan Fray, [facility] will call back to schedule appointment.

## 2018-06-26 ENCOUNTER — Ambulatory Visit: Payer: 59 | Admitting: Obstetrics & Gynecology

## 2018-08-20 MED FILL — LOSARTAN POTASSIUM 50 MG TA: 50 | 90 days supply | Qty: 90 | Fill #1

## 2018-08-20 MED FILL — HYDROCHLOROTHIAZIDE 12.5 MG: 12.5 | 90 days supply | Qty: 90 | Fill #0

## 2018-09-24 ENCOUNTER — Telehealth: Payer: Self-pay | Admitting: General Surgery

## 2018-09-24 NOTE — Telephone Encounter (Signed)
Patient unable to get out of work tomorrow. She has rescheduled until 10/11/2018

## 2018-09-24 NOTE — Telephone Encounter (Signed)
Contacted the patient to

## 2018-09-25 ENCOUNTER — Ambulatory Visit: Payer: 59 | Admitting: Physician Assistant

## 2018-10-10 ENCOUNTER — Ambulatory Visit (INDEPENDENT_AMBULATORY_CARE_PROVIDER_SITE_OTHER): Payer: 59

## 2018-10-10 DIAGNOSIS — Z111 Encounter for screening for respiratory tuberculosis: Secondary | ICD-10-CM

## 2018-10-10 NOTE — Progress Notes (Signed)
Per orders of Alma Friendly, NP, TB skin test given by Lurlean Nanny.

## 2018-10-11 ENCOUNTER — Encounter: Payer: Self-pay | Admitting: Gastroenterology

## 2018-10-11 ENCOUNTER — Ambulatory Visit: Payer: 59 | Admitting: Gastroenterology

## 2018-10-11 ENCOUNTER — Other Ambulatory Visit (INDEPENDENT_AMBULATORY_CARE_PROVIDER_SITE_OTHER): Payer: 59

## 2018-10-11 ENCOUNTER — Other Ambulatory Visit: Payer: Self-pay

## 2018-10-11 ENCOUNTER — Encounter: Payer: Self-pay | Admitting: Obstetrics & Gynecology

## 2018-10-11 ENCOUNTER — Ambulatory Visit (INDEPENDENT_AMBULATORY_CARE_PROVIDER_SITE_OTHER): Payer: 59 | Admitting: Obstetrics & Gynecology

## 2018-10-11 VITALS — BP 106/78 | HR 80 | Temp 98.4°F | Ht 63.0 in | Wt 216.8 lb

## 2018-10-11 VITALS — BP 121/83 | HR 72 | Wt 217.0 lb

## 2018-10-11 DIAGNOSIS — R7989 Other specified abnormal findings of blood chemistry: Secondary | ICD-10-CM

## 2018-10-11 DIAGNOSIS — K625 Hemorrhage of anus and rectum: Secondary | ICD-10-CM

## 2018-10-11 DIAGNOSIS — R945 Abnormal results of liver function studies: Secondary | ICD-10-CM | POA: Diagnosis not present

## 2018-10-11 DIAGNOSIS — Z124 Encounter for screening for malignant neoplasm of cervix: Secondary | ICD-10-CM | POA: Diagnosis not present

## 2018-10-11 DIAGNOSIS — Z1151 Encounter for screening for human papillomavirus (HPV): Secondary | ICD-10-CM | POA: Diagnosis not present

## 2018-10-11 DIAGNOSIS — Z01419 Encounter for gynecological examination (general) (routine) without abnormal findings: Secondary | ICD-10-CM | POA: Diagnosis not present

## 2018-10-11 DIAGNOSIS — K760 Fatty (change of) liver, not elsewhere classified: Secondary | ICD-10-CM | POA: Diagnosis not present

## 2018-10-11 DIAGNOSIS — Z113 Encounter for screening for infections with a predominantly sexual mode of transmission: Secondary | ICD-10-CM | POA: Diagnosis not present

## 2018-10-11 LAB — CBC WITH DIFFERENTIAL/PLATELET
Basophils Absolute: 0.1 10*3/uL (ref 0.0–0.1)
Basophils Relative: 0.6 % (ref 0.0–3.0)
Eosinophils Absolute: 0.3 10*3/uL (ref 0.0–0.7)
Eosinophils Relative: 3 % (ref 0.0–5.0)
HCT: 38.6 % (ref 36.0–46.0)
Hemoglobin: 12.9 g/dL (ref 12.0–15.0)
Lymphocytes Relative: 23 % (ref 12.0–46.0)
Lymphs Abs: 2.3 10*3/uL (ref 0.7–4.0)
MCHC: 33.4 g/dL (ref 30.0–36.0)
MCV: 86.6 fl (ref 78.0–100.0)
Monocytes Absolute: 0.7 10*3/uL (ref 0.1–1.0)
Monocytes Relative: 7.5 % (ref 3.0–12.0)
Neutro Abs: 6.5 10*3/uL (ref 1.4–7.7)
Neutrophils Relative %: 65.9 % (ref 43.0–77.0)
Platelets: 276 10*3/uL (ref 150.0–400.0)
RBC: 4.45 Mil/uL (ref 3.87–5.11)
RDW: 14.4 % (ref 11.5–15.5)
WBC: 9.9 10*3/uL (ref 4.0–10.5)

## 2018-10-11 NOTE — Patient Instructions (Signed)
Go to the basement today for labs  You have been scheduled for a colonoscopy. Please follow written instructions given to you at your visit today.  Please pick up your prep supplies at the pharmacy within the next 1-3 days. If you use inhalers (even only as needed), please bring them with you on the day of your procedure.   Thank You for choosing [facility]

## 2018-10-11 NOTE — Progress Notes (Signed)
10/11/2018 Colleen Boone AKYA FIORELLO [id number] [birth year]   HISTORY OF PRESENT ILLNESS: This is a pleasant 48 year-old female who is new to our office.  She has been referred here by Colleen Ivory, NP, for evaluation of rectal bleeding.  She tells me that she has been having bright red to maroon-colored blood with most of her bowel movements for the past 2 years.  She denies seeing any sign of bleeding in her undergarments between bowel movements.  She says that it is usually on the toilet paper, but sometimes they are mucousy pieces with some blood streaks on them in the toilet bowl as well.  She says that her bowel movements have been soft, but in the past previously she had issues with constipation.  She also reports occasional left-sided abdominal pain when she has to have a bowel movement.  Denies rectal pain.  She does mention abdominal bloating and weight gain.  While she is here she also tells me that her mother had Colleen Boone cirrhosis.  Her LFTs have been slightly elevated with peak ALT of 73 and peak AST of 52 over the past 2 years.  Total bili and alk phos have been normal.  Most recent ALT was 62 and AST was 43.  She has had hep B surface antigens that have been negative and negative hep C antibodies as well.  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  Elevated LFTs prompted imaging studies so she is undergone ultrasound and also an MRI of the liver.  MRI showed severe diffuse hepatic steatosis, but was otherwise unremarkable.   Past Medical History:  Diagnosis Date   Abnormal Pap smear of cervix    Accessory ureter    Anxiety    tapered off sertraline, stable.   Fibroids 12/06/2011   Ultrasound showed: 8.8 x 4.6 x 5.6 cm uterus. 2.0 cm intramural fibroid in the uterine fundus, second intramural 1.7 cm fibroid in the right anterior upper corpus. EM 5 mm, no focal lesion. Normal ovaries.    GERD (gastroesophageal reflux disease)    Hay fever    Herpes  labialis    acid foods and stress incite   History of gestational diabetes    HLD (hyperlipidemia)    HTN (hypertension)    Obesity    Uterine polyp    Vaginal cyst    Past Surgical History:  Procedure Laterality Date   DILATION AND CURETTAGE OF UTERUS  2007   tempanostomy     TONSILLECTOMY  1975   WISDOM TOOTH EXTRACTION      x4    reports that she has quit smoking. She has never used smokeless tobacco. She reports current alcohol use. She reports that she does not use drugs. family history includes Bladder Cancer in her maternal uncle; Cancer in her maternal grandmother; Cancer (age of onset: 61) in her father; Colon cancer (age of onset: 53) in her maternal uncle; Coronary artery disease (age of onset: 65) in her father; Deep vein thrombosis in her brother; Diabetes in her brother, father, and mother; Heart attack in her maternal grandfather and paternal grandfather; Hyperlipidemia in her father; Hypertension in her father, maternal grandfather, maternal grandmother, mother, paternal grandfather, and paternal grandmother; Liver disease in her mother; Other in her mother; Stroke in her maternal grandmother, paternal grandfather, and paternal grandmother. Allergies  Allergen Reactions   Latex Rash   Aleve [Naproxen Sodium]     Hot flashes  Simvastatin Other (See Comments)    fatigue      Outpatient Encounter Medications as of 10/11/2018  Medication Sig   Acetaminophen (TYLENOL) 325 MG CAPS    esomeprazole (NEXIUM) 20 MG capsule Take 20 mg by mouth daily at 12 noon.   hydrochlorothiazide (HYDRODIURIL) 12.5 MG tablet Take 1 tablet (12.5 mg total) by mouth daily. For blood pressure   Ibuprofen 200 MG CAPS    levonorgestrel (MIRENA) 20 MCG/24HR IUD 1 each by Intrauterine route once. Inserted 03/2016   losartan (COZAAR) 50 MG tablet Take 1 tablet (50 mg total) by mouth daily. For blood pressure   Melatonin 5 MG CHEW    valACYclovir (VALTREX) 1000 MG tablet TAKE 1  TABLET (1,000 MG TOTAL) BY MOUTH DAILY. (Patient taking differently: Take 1,000 mg by mouth as needed. )   No facility-administered encounter medications on file as of 10/11/2018.      REVIEW OF SYSTEMS  : All other systems reviewed and negative except where noted in the History of Present Illness.   PHYSICAL EXAM: BP 106/78    Pulse 80    Temp 98.4 F (36.9 C)    Ht '5\' 3"'$  (1.6 m)    Wt 216 lb 12.8 oz (98.3 kg)    BMI 38.40 kg/m  General: Well developed white female in no acute distress Head: Normocephalic and atraumatic Eyes:  Sclerae anicteric, conjunctiva pink. Ears: Normal auditory acuity Lungs: Clear throughout to auscultation; no increased WOB. Heart: Regular rate and rhythm; no M/R/G. Abdomen: Soft, non-distended.  BS present.  Non-tender. Rectal:  Will be done at the time of colonoscopy. Musculoskeletal: Symmetrical with no gross deformities  Skin: No lesions on visible extremities Extremities: No edema  Neurological: Alert oriented x 4, grossly non-focal Psychological:  Alert and cooperative. Normal mood and affect  ASSESSMENT AND PLAN: *Rectal bleeding: Very possibly hemorrhoidal, but has been occurring with most bowel movements for the past 2 years.  Has occasional left-sided abdominal pain with bowel movements as well.  Will schedule for colonoscopy with Dr. Carlean Purl. *Severe hepatic steatosis: Seen on imaging studies.  AST and ALT mildly elevated.  She reports that her mother had NASH cirrhosis.  Discussed the importance of good diet, exercise, weight loss, good blood sugar and cholesterol control to try to help with this and prevent progression.  Hep C antibodies negative.  Will check hep B antibody and total hep a antibodies as well.  Will need vaccinations if antibodies negative.  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  No other liver serologies have been performed at this point.   CC:  Pleas Koch, NP

## 2018-10-11 NOTE — Progress Notes (Signed)
Subjective:    Colleen Boone is a 48 y.o. single P65 (18 yo son) female who presents for an annual exam. The patient has no complaints today. She would like STI testing with HSV2 and LFTs. She is having some external vulvar irritation. She has tried cortisone cream (She has a h/o excema). Sometimes she has a little fissure of the skin.  The patient is not currently sexually active. GYN screening history: last pap: was normal. The patient wears seatbelts: yes. The patient participates in regular exercise: no. Has the patient ever been transfused or tattooed?: yes. The patient reports that there is not domestic violence in her life.   Menstrual History: OB History    Gravida  2   Para  1   Term  1   Preterm  0   AB  1   Living  1     SAB  0   TAB  1   Ectopic  0   Multiple  0   Live Births  1           Menarche age: 29 Patient's last menstrual period was 09/05/2018.    The following portions of the patient's history were reviewed and updated as appropriate: allergies, current medications, past family history, past medical history, past social history, past surgical history and problem list.  Review of Systems Pertinent items are noted in HPI.   FH- no gyn or breast/ + colon in materal uncle and + bladder cancer in another maternal uncle, + lymphoma in father [facility] peds ER nurse   Objective:    BP 121/83   Pulse 72   Wt 217 lb (98.4 kg)   LMP 09/05/2018   BMI 38.44 kg/m   General Appearance:    Alert, cooperative, no distress, appears stated age  Head:    Normocephalic, without obvious abnormality, atraumatic  Eyes:    PERRL, conjunctiva/corneas clear, EOM's intact, fundi    benign, both eyes  Ears:    Normal TM's and external ear canals, both ears  Nose:   Nares normal, septum midline, mucosa normal, no drainage    or sinus tenderness  Throat:   Lips, mucosa, and tongue normal; teeth and gums normal  Neck:   Supple, symmetrical, trachea midline, no  adenopathy;    thyroid:  no enlargement/tenderness/nodules; no carotid   bruit or JVD  Back:     Symmetric, no curvature, ROM normal, no CVA tenderness  Lungs:     Clear to auscultation bilaterally, respirations unlabored  Chest Wall:    No tenderness or deformity   Heart:    Regular rate and rhythm, S1 and S2 normal, no murmur, rub   or gallop  Breast Exam:    No tenderness, masses, or nipple abnormality  Abdomen:     Soft, non-tender, bowel sounds active all four quadrants,    no masses, no organomegaly  Genitalia:    Normal female without lesion, discharge or tenderness, slightly dry area above the clitorus normal size and shape, anteverted, mobile, non-tender, normal adnexal exam      Extremities:   Extremities normal, atraumatic, no cyanosis or edema  Pulses:   2+ and symmetric all extremities  Skin:   Skin color, texture, turgor normal, no rashes or lesions  Lymph nodes:   Cervical, supraclavicular, and axillary nodes normal  Neurologic:   CNII-XII intact, normal strength, sensation and reflexes    throughout  .    Assessment:    Healthy female exam.  Plan:     Thin prep Pap smear. with cotesting STI testing per patient request, incl HSV 2

## 2018-10-11 NOTE — Progress Notes (Signed)
Vaginal itching  Last pap-normal  03/15/2016 STI with blood work  Would like to add on liver panel

## 2018-10-12 DIAGNOSIS — K760 Fatty (change of) liver, not elsewhere classified: Secondary | ICD-10-CM | POA: Insufficient documentation

## 2018-10-12 DIAGNOSIS — R7989 Other specified abnormal findings of blood chemistry: Secondary | ICD-10-CM | POA: Insufficient documentation

## 2018-10-12 LAB — HEPATITIS A ANTIBODY, TOTAL: Hepatitis A AB,Total: NONREACTIVE

## 2018-10-12 LAB — HEPATITIS C ANTIBODY: Hep C Virus Ab: <0.1 s/co ratio (ref 0.0–0.9)

## 2018-10-12 LAB — HEPATIC FUNCTION PANEL
ALT: 62 IU/L — ABNORMAL HIGH (ref 0–32)
AST: 43 IU/L — ABNORMAL HIGH (ref 0–40)
Albumin: 4.3 g/dL (ref 3.8–4.8)
Alkaline Phosphatase: 79 IU/L (ref 39–117)
Bilirubin Total: 0.6 mg/dL (ref 0.0–1.2)
Bilirubin, Direct: 0.13 mg/dL (ref 0.00–0.40)
Total Protein: 6.5 g/dL (ref 6.0–8.5)

## 2018-10-12 LAB — HEPATITIS B SURFACE ANTIGEN: Hepatitis B Surface Ag: NEGATIVE

## 2018-10-12 LAB — HEPATITIS B SURFACE ANTIBODY,QUALITATIVE: Hep B S Ab: REACTIVE — AB

## 2018-10-12 LAB — HIV ANTIBODY (ROUTINE TESTING W REFLEX): HIV Screen 4th Generation wRfx: NONREACTIVE

## 2018-10-12 LAB — TB SKIN TEST
Induration: 0 mm
TB Skin Test: NEGATIVE

## 2018-10-12 LAB — HSV 2 ANTIBODY, IGG: HSV 2 IgG, Type Spec: <0.91 index (ref 0.00–0.90)

## 2018-10-15 ENCOUNTER — Telehealth: Payer: Self-pay | Admitting: *Deleted

## 2018-10-15 LAB — CYTOLOGY - PAP
Chlamydia: NEGATIVE
Diagnosis: NEGATIVE
HPV: NOT DETECTED
Neisseria Gonorrhea: NEGATIVE
Trichomonas: NEGATIVE

## 2018-10-15 NOTE — Telephone Encounter (Signed)
Pt called wanting to get lab results, informed of results and notified that Dr Hulan Fray had not reviewed them and she may send her a message via mychart.

## 2018-10-17 ENCOUNTER — Encounter: Payer: Self-pay | Admitting: Internal Medicine

## 2018-10-23 ENCOUNTER — Telehealth: Payer: Self-pay

## 2018-10-23 NOTE — Telephone Encounter (Signed)
Covid-19 screening questions   Do you now or have you had a fever in the last 14 days?  Do you have any respiratory symptoms of shortness of breath or cough now or in the last 14 days?  Do you have any family members or close contacts with diagnosed or suspected Covid-19 in the past 14 days?  Have you been tested for Covid-19 and found to be positive?      Left message to call back

## 2018-10-23 NOTE — Telephone Encounter (Signed)
Pt responded "no" to all questions.  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

## 2018-10-24 ENCOUNTER — Encounter: Payer: Self-pay | Admitting: Internal Medicine

## 2018-10-24 ENCOUNTER — Ambulatory Visit (AMBULATORY_SURGERY_CENTER): Payer: 59 | Admitting: Internal Medicine

## 2018-10-24 ENCOUNTER — Other Ambulatory Visit: Payer: Self-pay

## 2018-10-24 VITALS — BP 131/78 | HR 71 | Temp 98.4°F | Resp 26 | Ht 63.0 in | Wt 216.0 lb

## 2018-10-24 DIAGNOSIS — D125 Benign neoplasm of sigmoid colon: Secondary | ICD-10-CM | POA: Diagnosis not present

## 2018-10-24 DIAGNOSIS — C187 Malignant neoplasm of sigmoid colon: Secondary | ICD-10-CM | POA: Diagnosis not present

## 2018-10-24 DIAGNOSIS — K625 Hemorrhage of anus and rectum: Secondary | ICD-10-CM

## 2018-10-24 MED ORDER — SODIUM CHLORIDE 0.9 % IV SOLN: 500.0000 mL | Freq: Once | INTRAVENOUS | Status: DC

## 2018-10-24 NOTE — Progress Notes (Signed)
PT taken to PACU. Monitors in place. VSS. Report given to RN. 

## 2018-10-24 NOTE — Op Note (Addendum)
[place] Patient Name: Colleen Boone Procedure Date: 10/24/2018 9:07 AM MRN: [id number] Endoscopist: Gatha Mayer , MD Age: 48 Referring MD:  Date of Birth: Jun 04, 1970 Gender: Female Account #: [id number] Procedure:                Colonoscopy Indications:              Rectal bleeding Medicines:                Propofol per Anesthesia, Monitored Anesthesia Care Procedure:                Pre-Anesthesia Assessment:                           - Prior to the procedure, a History and Physical                            was performed, and patient medications and                            allergies were reviewed. The patient's tolerance of                            previous anesthesia was also reviewed. The risks                            and benefits of the procedure and the sedation                            options and risks were discussed with the patient.                            All questions were answered, and informed consent                            was obtained. Prior Anticoagulants: The patient has                            taken no previous anticoagulant or antiplatelet                            agents. ASA Grade Assessment: II - A patient with                            mild systemic disease. After reviewing the risks                            and benefits, the patient was deemed in                            satisfactory condition to undergo the procedure.                           After obtaining informed consent, the colonoscope  was passed under direct vision. Throughout the                            procedure, the patient's blood pressure, pulse, and                            oxygen saturations were monitored continuously. The                            Colonoscope was introduced through the anus and                            advanced to the the cecum, identified by                            appendiceal orifice and  ileocecal valve. The                            colonoscopy was performed without difficulty. The                            patient tolerated the procedure well. The quality                            of the bowel preparation was excellent. The bowel                            preparation used was Miralax via split dose                            instruction. The ileocecal valve, appendiceal                            orifice, and rectum were photographed. Scope In: 9:20:36 AM Scope Out: 10:03:59 AM Scope Withdrawal Time: 0 hours 40 minutes 58 seconds  Total Procedure Duration: 0 hours 43 minutes 23 seconds  Findings:                 The perianal and digital rectal examinations were                            normal.                           A 30 mm polyp was found in the distal sigmoid                            colon. The polyp was pedunculated. The polyp was                            removed with a hot snare. Resection and retrieval                            were complete. Verification of patient  identification for the specimen was done. Estimated                            blood loss was minimal.                           Three pedunculated polyps were found in the                            proximal sigmoid colon, mid sigmoid colon and                            distal sigmoid colon. The polyps were 8 to 12 mm in                            size. These polyps were removed with a hot snare.                            Resection and retrieval were complete. Verification                            of patient identification for the specimen was                            done. Estimated blood loss was minimal.                           Multiple diverticula were found in the sigmoid                            colon.                           The exam was otherwise without abnormality on                            direct and retroflexion views. Complications:             No immediate complications. Estimated Blood Loss:     Estimated blood loss was minimal. Impression:               - One 30 mm polyp in the distal sigmoid colon,                            removed with a hot snare. Resected and retrieved.                            Piecemeal removal and 3 clips applied after. Failed                            with endoloop.                           - Three 8 to 12 mm polyps in the proximal sigmoid  colon, in the mid sigmoid colon and in the distal                            sigmoid colon, removed with a hot snare. Resected                            and retrieved.                           - Diverticulosis in the sigmoid colon.                           - The examination was otherwise normal on direct                            and retroflexion views. Recommendation:           - Patient has a contact number available for                            emergencies. The signs and symptoms of potential                            delayed complications were discussed with the                            patient. Return to normal activities tomorrow.                            Written discharge instructions were provided to the                            patient.                           - Resume previous diet.                           - Continue present medications.                           - No aspirin, ibuprofen, naproxen, or other                            non-steroidal anti-inflammatory drugs for 2 weeks                            after polyp removal.                           - Repeat colonoscopy is recommended for                            surveillance. The colonoscopy date will be                            determined  after pathology results from today's                            exam become available for review.                           - We discussed weight loss for NASH and will plan                             f/u in office later also Gatha Mayer, MD 10/24/2018 10:15:29 AM This report has been signed electronically.

## 2018-10-24 NOTE — Progress Notes (Signed)
Called to room to assist during endoscopic procedure.  Patient ID and intended procedure confirmed with present staff. Received instructions for my participation in the procedure from the performing physician.  

## 2018-10-24 NOTE — Patient Instructions (Addendum)
I found and removed 4 polyps. One was quite large. I think they are all benign.  As far as the NASH goes I would like to see you back about that if you are willing.  Limit wine to 1-2 glasses a week.  For weight loss check out [email or web address] And [email or web address]  There is a [facility] in [place] also   I appreciate the opportunity to care for you. Gatha Mayer, MD, FACG  NO ASPIRIN,IBUPROFEN,NAPROXEN,OR OTHER NON STEROIDAL ANTI-INFLAMMATORY DRUGS FOR 2 WEEKS AFTER REMOVAL OF POLYPS   Information on polyps & diverticulosis  given to you today  Await pathology results on polyps removed  A CARD TO CARRY IN WALLET WAS GIVEN TO YOU ( INDICATING A CLIP WAS PLACED IN THE DISTAL SIGMOID COLON POLYPECTOMY SITE)   YOU HAD AN ENDOSCOPIC PROCEDURE TODAY AT [place] [facility]:   Refer to the procedure report that was given to you for any specific questions about what was found during the examination.  If the procedure report does not answer your questions, please call your gastroenterologist to clarify.  If you requested that your care partner not be given the details of your procedure findings, then the procedure report has been included in a sealed envelope for you to review at your convenience later.  YOU SHOULD EXPECT: Some feelings of bloating in the abdomen. Passage of more gas than usual.  Walking can help get rid of the air that was put into your GI tract during the procedure and reduce the bloating. If you had a lower endoscopy (such as a colonoscopy or flexible sigmoidoscopy) you may notice spotting of blood in your stool or on the toilet paper. If you underwent a bowel prep for your procedure, you may not have a normal bowel movement for a few days.  Please Note:  You might notice some irritation and congestion in your nose or some drainage.  This is from the oxygen used during your procedure.  There is no need for concern and  it should clear up in a day or so.  SYMPTOMS TO REPORT IMMEDIATELY:   Following lower endoscopy (colonoscopy or flexible sigmoidoscopy):  Excessive amounts of blood in the stool  Significant tenderness or worsening of abdominal pains  Swelling of the abdomen that is new, acute  Fever of 100F or higher    For urgent or emergent issues, a gastroenterologist can be reached at any hour by calling [phone].   DIET:  We do recommend a small meal at first, but then you may proceed to your regular diet.  Drink plenty of fluids but you should avoid alcoholic beverages for 24 hours.  ACTIVITY:  You should plan to take it easy for the rest of today and you should NOT DRIVE or use heavy machinery until tomorrow (because of the sedation medicines used during the test).    FOLLOW UP: Our staff will call the number listed on your records 48-72 hours following your procedure to check on you and address any questions or concerns that you may have regarding the information given to you following your procedure. If we do not reach you, we will leave a message.  We will attempt to reach you two times.  During this call, we will ask if you have developed any symptoms of COVID 19. If you develop any symptoms (ie: fever, flu-like symptoms, shortness of breath, cough etc.) before then, please call [phone].  If you test positive for  Covid 19 in the 2 weeks post procedure, please call and report this information to [place].    If any biopsies were taken you will be contacted by phone or by letter within the next 1-3 weeks.  Please call us at [phone] if you have not heard about the biopsies in 3 weeks.    SIGNATURES/CONFIDENTIALITY: You and/or your care partner have signed paperwork which will be entered into your electronic medical record.  These signatures attest to the fact that that the information above on your After Visit Summary has been reviewed and is understood.  Full responsibility of the  confidentiality of this discharge information lies with you and/or your care-partner.

## 2018-10-26 ENCOUNTER — Telehealth: Payer: Self-pay

## 2018-10-26 ENCOUNTER — Telehealth: Payer: Self-pay | Admitting: *Deleted

## 2018-10-26 DIAGNOSIS — I10 Essential (primary) hypertension: Secondary | ICD-10-CM | POA: Diagnosis not present

## 2018-10-26 DIAGNOSIS — Z1211 Encounter for screening for malignant neoplasm of colon: Secondary | ICD-10-CM | POA: Diagnosis not present

## 2018-10-26 NOTE — Telephone Encounter (Signed)
  Follow up Call-  Call back number 10/24/2018  Post procedure Call Back phone  # [phone]  Permission to leave phone message Yes  Some recent data might be hidden     Patient questions:  Do you have a fever, pain , or abdominal swelling? No. Pain Score  0 *  Have you tolerated food without any problems? Yes.    Have you been able to return to your normal activities? Yes.    Do you have any questions about your discharge instructions: Diet   No. Medications  No. Follow up visit  No.  Do you have questions or concerns about your Care? No.  Actions: * If pain score is 4 or above: No action needed, pain <4.  1. Have you developed a fever since your procedure? no  2.   Have you had an respiratory symptoms (SOB or cough) since your procedure? no  3.   Have you tested positive for COVID 19 since your procedure no  4.   Have you had any family members/close contacts diagnosed with the COVID 19 since your procedure?  no   If yes to any of these questions please route to Joylene John, RN and Alphonsa Gin, Therapist, sports.

## 2018-10-26 NOTE — Telephone Encounter (Signed)
First attempt follow up call to pt, no answer, left message for pt to call if any problems, otherwise we will call later today.

## 2018-10-29 NOTE — Progress Notes (Signed)
I called patient and explained that the large polyp contained cancer  Looks completely removed but needs a flex sig w/ tattoo for 0800 8/28 see staff message also)  Anticipate reviewing at GI cancer conference so will ask for it to be on list [facility])  No letter or recall right now

## 2018-10-30 ENCOUNTER — Other Ambulatory Visit: Payer: Self-pay

## 2018-10-30 ENCOUNTER — Telehealth: Payer: Self-pay | Admitting: Internal Medicine

## 2018-10-30 DIAGNOSIS — C801 Malignant (primary) neoplasm, unspecified: Secondary | ICD-10-CM

## 2018-10-30 NOTE — Telephone Encounter (Signed)
Check your staff messages and results - flex sig 0800 Friday

## 2018-10-30 NOTE — Telephone Encounter (Signed)
See pathology results for additional details 

## 2018-10-30 NOTE — Telephone Encounter (Signed)
Am I supposed to be scheduling anything?

## 2018-10-31 ENCOUNTER — Encounter: Payer: Self-pay | Admitting: Internal Medicine

## 2018-11-01 ENCOUNTER — Telehealth: Payer: Self-pay

## 2018-11-01 NOTE — Telephone Encounter (Signed)
Returned call and answered "NO" to all screening questions. Advised to check in on the 4th floor and that care partner is welcomed to wait in the car or come up to the lobby. Also advised for both to wear a mask when they enter the building.

## 2018-11-01 NOTE — Telephone Encounter (Signed)
Covid-19 screening questions   Do you now or have you had a fever in the last 14 days?  Do you have any respiratory symptoms of shortness of breath or cough now or in the last 14 days?  Do you have any family members or close contacts with diagnosed or suspected Covid-19 in the past 14 days?  Have you been tested for Covid-19 and found to be positive?    L/m to c/b

## 2018-11-02 ENCOUNTER — Other Ambulatory Visit: Payer: Self-pay

## 2018-11-02 ENCOUNTER — Ambulatory Visit (AMBULATORY_SURGERY_CENTER): Payer: 59 | Admitting: Internal Medicine

## 2018-11-02 ENCOUNTER — Other Ambulatory Visit: Payer: 59

## 2018-11-02 ENCOUNTER — Encounter: Payer: Self-pay | Admitting: Internal Medicine

## 2018-11-02 ENCOUNTER — Other Ambulatory Visit: Payer: 59 | Admitting: Internal Medicine

## 2018-11-02 VITALS — BP 98/60 | HR 76 | Temp 98.4°F | Resp 20 | Ht 63.0 in | Wt 216.0 lb

## 2018-11-02 DIAGNOSIS — C801 Malignant (primary) neoplasm, unspecified: Secondary | ICD-10-CM | POA: Diagnosis not present

## 2018-11-02 DIAGNOSIS — C19 Malignant neoplasm of rectosigmoid junction: Secondary | ICD-10-CM | POA: Diagnosis not present

## 2018-11-02 DIAGNOSIS — K6389 Other specified diseases of intestine: Secondary | ICD-10-CM

## 2018-11-02 HISTORY — DX: Malignant neoplasm of rectosigmoid junction: C19

## 2018-11-02 MED ORDER — SODIUM CHLORIDE 0.9 % IV SOLN: 500.0000 mL | Freq: Once | INTRAVENOUS | Status: DC

## 2018-11-02 NOTE — Op Note (Signed)
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 The patient's tolerance of                            previous anesthesia was also reviewed. The risks                            and benefits of the procedure and the sedation                            options and risks were discussed with the patient.                            All questions were answered, and informed consent                            was obtained. Prior Anticoagulants: The patient has                            taken no previous anticoagulant or antiplatelet                            agents. ASA Grade Assessment: II - A patient with                            mild systemic disease. After reviewing the risks                            and benefits, the patient was deemed in                            satisfactory condition to undergo the procedure.  After obtaining informed consent, the scope was                            passed under direct vision. The Endoscope was                            introduced through the anus and advanced to the the                            sigmoid colon. The flexible sigmoidoscopy was                            accomplished without difficulty. The patient                            tolerated the  procedure well. The quality of the                            bowel preparation was excellent. Scope In: Scope Out: Findings:                 The perianal and digital rectal examinations were                            normal.                           Large and small polypectomy sites seen -                            inflammatory changes around both. 17-18 cm from                            anal verge and in rectosigmoid. Large polyp site                            where cancer snared with 1 retained clip placed                            last week and erythematous granular mucosa                            surrounding. Biopsies taken. Then proximal and                            distal submucosal tattoos with SPOTT were placed.                            Scope withdrawn. Biopsies were taken with a cold                            forceps for histology. Verification of patient                            identification for the  specimen was done. Estimated                            blood loss was minimal. Area was tattooed with an                            injection of Spot (carbon black).                           The exam was otherwise without abnormality. Complications:            No immediate complications. Estimated Blood Loss:     Estimated blood loss was minimal. Impression:               - The examination was otherwise normal.                           - Polypectomy sites - post-polypectomy inflammatory                            changes seen - biopsies taken from site of                            malignant polyp removal and tattoos placed Recommendation:           - Patient has a contact number available for                            emergencies. The signs and symptoms of potential                            delayed complications were discussed with the                            patient. Return to normal activities tomorrow.                            Written discharge  instructions were provided to the                            patient.                           - Resume previous diet.                           - Repeat flexible sigmoidoscopy for surveillance                            based on pathology results.                           - 1) CEA level today                           2) Will present at GI cancer conference 9/2 and  determine next steps - at this point with 51mm                            margin, no lymphovascular invasion it appears that                            the cancer is completely resected                           3) F/U path Gatha Mayer, MD 11/02/2018 11:29:04 AM This report has been signed electronically.

## 2018-11-02 NOTE — Patient Instructions (Addendum)
I took biopsies at the cancer polyp site and tattooed it so we always know where it was. I changed the terminology of the location to rectosigmoid - the area where rectum ends and colon begins - as today's measurements suggest that is more accurate. I do not think that means anything different for you.  It showed what I thought were typical post polyp removal inflammation.  I will let you know results when they are in - next week.  I will present you at the multi-disciplinary cancer conference next week and let you know the opinions.  I think the cancer is completely resected based upon what I know today but want to be sure as we discussed.  I do want you to go to the lab for a test called CEA today - it is sometimes elevated in patients with colon cancer and can be a useful marker.  I appreciate the opportunity to care for you. Gatha Mayer, MD, FACG  YOU HAD AN ENDOSCOPIC PROCEDURE TODAY AT [facility]:   Refer to the procedure report that was given to you for any specific questions about what was found during the examination.  If the procedure report does not answer your questions, please call your gastroenterologist to clarify.  If you requested that your care partner not be given the details of your procedure findings, then the procedure report has been included in a sealed envelope for you to review at your convenience later.  YOU SHOULD EXPECT: Some feelings of bloating in the abdomen. Passage of more gas than usual.  Walking can help get rid of the air that was put into your GI tract during the procedure and reduce the bloating. If you had a lower endoscopy (such as a colonoscopy or flexible sigmoidoscopy) you may notice spotting of blood in your stool or on the toilet paper. If you underwent a bowel prep for your procedure, you may not have a normal bowel movement for a few days.  Please Note:  You might notice some irritation and congestion in your nose or some  drainage.  This is from the oxygen used during your procedure.  There is no need for concern and it should clear up in a day or so.  SYMPTOMS TO REPORT IMMEDIATELY:   Following lower endoscopy (colonoscopy or flexible sigmoidoscopy):  Excessive amounts of blood in the stool  Significant tenderness or worsening of abdominal pains  Swelling of the abdomen that is new, acute  Fever of 100F or higher  For urgent or emergent issues, a gastroenterologist can be reached at any hour by calling [phone].  DIET:  We do recommend a small meal at first, but then you may proceed to your regular diet.  Drink plenty of fluids but you should avoid alcoholic beverages for 24 hours.  ACTIVITY:  You should plan to take it easy for the rest of today and you should NOT DRIVE or use heavy machinery until tomorrow (because of the sedation medicines used during the test).    FOLLOW UP: Our staff will call the number listed on your records 48-72 hours following your procedure to check on you and address any questions or concerns that you may have regarding the information given to you following your procedure. If we do not reach you, we will leave a message.  We will attempt to reach you two times.  During this call, we will ask if you have developed any symptoms of COVID 19. If you  develop any symptoms (ie: fever, flu-like symptoms, shortness of breath, cough etc.) before then, please call [phone].  If you test positive for Covid 19 in the 2 weeks post procedure, please call and report this information to [place].    If any biopsies were taken you will be contacted by phone or by letter within the next 1-3 weeks.  Please call us at [phone] if you have not heard about the biopsies in 3 weeks.   SIGNATURES/CONFIDENTIALITY: You and/or your care partner have signed paperwork which will be entered into your electronic medical record.  These signatures attest to the fact that that the information above on  your After Visit Summary has been reviewed and is understood.  Full responsibility of the confidentiality of this discharge information lies with you and/or your care-partner.

## 2018-11-02 NOTE — Progress Notes (Signed)
Report given to PACU, vss 

## 2018-11-02 NOTE — Progress Notes (Signed)
Temp taken by AR VS taken by CW

## 2018-11-02 NOTE — Progress Notes (Signed)
Called to room to assist during endoscopic procedure.  Patient ID and intended procedure confirmed with present staff. Received instructions for my participation in the procedure from the performing physician.  

## 2018-11-05 ENCOUNTER — Telehealth: Payer: Self-pay | Admitting: Internal Medicine

## 2018-11-05 ENCOUNTER — Telehealth: Payer: Self-pay

## 2018-11-05 LAB — CEA: CEA: 1.9 ng/mL

## 2018-11-05 NOTE — Telephone Encounter (Signed)
Lm on VM (okay per DPR) for patient to call us with an update on how she is feeling as needed.  Also, recommended given symptoms and recent procedure that she should contact Dr. Carlean Purl and alert him as well.   Patient to call back with update as needed.

## 2018-11-05 NOTE — Telephone Encounter (Signed)
Noted and agree. 

## 2018-11-05 NOTE — Telephone Encounter (Signed)
[facility] - Client TELEPHONE ADVICE RECORD [facility] Patient Name: Colleen Boone Gender: Female DOB: 01/07/1971 Age: 48 Y 64 M 23 D Return Phone Number: [phone] (Primary) Address: City/State/Zip: [place] [address] Client [facility] - Client Client Site [facility] Physician Alma Friendly - NP Contact Type Call Who Is Calling Patient / Member / Family / Caregiver Call Type Triage / Clinical Relationship To Patient Self Return Phone Number [phone] (Primary) Chief Complaint Urination Frequency Reason for Call Symptomatic / Request for Health Information Initial Comment The caller has urinary frequency and she has the urgency with little output. She had a colonoscopy and she has had a lot of liquid stool. Translation No Nurse Assessment Nurse: Hardin Negus, RN, Mardene Celeste Date/Time Eilene Ghazi Time): 11/03/2018 3:24:01 PM Confirm and document reason for call. If symptomatic, describe symptoms. ---The caller has urinary frequency and she has the urgency. She had a colonoscopy and she has had a lot of liquid stool. No fever, no abdominal pain, and no nausea. She has been eating and drinking. Has the patient had close contact with a person known or suspected to have the novel coronavirus illness OR traveled / lives in area with major community spread (including international travel) in the last 14 days from the onset of symptoms? * If Asymptomatic, screen for exposure and travel within the last 14 days. ---No Does the patient have any new or worsening symptoms? ---Yes Will a triage be completed? ---Yes Related visit to physician within the last 2 weeks? ---No Does the PT have any chronic conditions? (i.e. diabetes, asthma, this includes High risk factors for pregnancy, etc.) ---Yes List chronic conditions. ---hypertension, cholesterol m reflux Is the patient pregnant or possibly  pregnant? (Ask all females between the ages of 27-55) ---No Is this a behavioral health or substance abuse call? ---No Guidelines Guideline Title Affirmed Question Affirmed Notes Nurse Date/Time Eilene Ghazi Time) Urination Pain - Female Side (flank) or lower back pain present Oren Bracket 11/03/2018 3:27:19 PM PLEASE NOTE: All timestamps contained within this report are represented as [place] Standard Time. CONFIDENTIALTY NOTICE: This fax transmission is intended only for the addressee. It contains information that is legally privileged, confidential or otherwise protected from use or disclosure. If you are not the intended recipient, you are strictly prohibited from reviewing, disclosing, copying using or disseminating any of this information or taking any action in reliance on or regarding this information. If you have received this fax in error, please notify us immediately by telephone so that we can arrange for its return to [place]. Phone: [phone], Toll-Free: [phone], Fax: [phone] Page: 2 of 2 Call Id: [id number] [place]. Time Eilene Ghazi Time) Disposition Final User 11/03/2018 3:09:38 PM Send To Nurse Wallie Char, RN, Kaila 11/03/2018 3:29:22 PM See HCP within 4 Hours (or PCP triage) Yes Hardin Negus, RN, [place] P[place] Disagree/Comply Comply Caller Understands Yes PreDisposition Call Doctor Care Advice Given Per Guideline CALL BACK IF: * You become worse. CARE ADVICE given per Urination Pain - Female (Adult) guideline. Referrals GO TO FACILITY UNDECIDED

## 2018-11-06 ENCOUNTER — Telehealth: Payer: Self-pay | Admitting: *Deleted

## 2018-11-06 ENCOUNTER — Ambulatory Visit (INDEPENDENT_AMBULATORY_CARE_PROVIDER_SITE_OTHER): Payer: 59 | Admitting: Primary Care

## 2018-11-06 ENCOUNTER — Encounter: Payer: Self-pay | Admitting: Primary Care

## 2018-11-06 ENCOUNTER — Other Ambulatory Visit: Payer: Self-pay

## 2018-11-06 ENCOUNTER — Telehealth: Payer: Self-pay

## 2018-11-06 DIAGNOSIS — F1010 Alcohol abuse, uncomplicated: Secondary | ICD-10-CM | POA: Diagnosis not present

## 2018-11-06 NOTE — Assessment & Plan Note (Signed)
Ready to quit drinking alcohol nightly out of habit. Discussed to work on changing her night routine, finding ways to occupy her time and mind. Referral placed for therapy to assist with her goals. She is agreeable to this plan and does not wish to take medication.

## 2018-11-06 NOTE — Progress Notes (Signed)
CEA is normal Will discuss more with her after GI cancer conference

## 2018-11-06 NOTE — Telephone Encounter (Signed)
  Follow up Call-  Call back number 11/02/2018 10/24/2018  Post procedure Call Back phone  # [phone]  Permission to leave phone message Yes Yes  Some recent data might be hidden     Patient questions:  Do you have a fever, pain , or abdominal swelling? No. Pain Score  0 *  Have you tolerated food without any problems? Yes.    Have you been able to return to your normal activities? Yes.    Do you have any questions about your discharge instructions: Diet   No. Medications  No. Follow up visit  No.  Do you have questions or concerns about your Care? No.  Actions: * If pain score is 4 or above: No action needed, pain <4. 1. Have you developed a fever since your procedure? no  2.   Have you had an respiratory symptoms (SOB or cough) since your procedure? no  3.   Have you tested positive for COVID 19 since your procedure no  4.   Have you had any family members/close contacts diagnosed with the COVID 19 since your procedure?  no   If yes to any of these questions please route to Joylene John, RN and Alphonsa Gin, Therapist, sports.

## 2018-11-06 NOTE — Progress Notes (Signed)
Subjective:    Patient ID: Colleen Boone, female    DOB: 04/13/1970, 48 y.o.   MRN: [id number]  HPI  Virtual Visit via Video Note  I connected with Colleen Boone on 11/06/18 at  7:40 AM EDT by a video enabled telemedicine application and verified that I am speaking with the correct person using two identifiers.  Location: Patient: Home Provider: Office   I discussed the limitations of evaluation and management by telemedicine and the availability of in person appointments. The patient expressed understanding and agreed to proceed.  History of Present Illness:  Colleen Boone is a 48 year old female with a history of anxiety disorder, elevated LFT's, obesity, colon cancer who presents today to discuss alcohol consumption.   Previously managed on Wellbutrin XL 150 mg, Prozac 20 mg, Lexapro 10 mg but didn't take for very long as she doesn't like taking antidepressants. Her main concern today is her alcohol consumption. She is currently drinking red wine at night, will typically drink two heaping glasses of wine to relax and "wind down" after work.  She often finds that she reaches for the bottle of wine due to boredom, stress, and out of habit.   She's been doing this for the last 15+ years and wants help to break the habit so she's not dependant on wine in order to relax. She is well aware that  it's not healthy to have two glasses of red wine every night. She doesn't drink during the day, only at night. History of DWI in the past and never drives when drinking now.  She denies daily anxiety, depression, cravings during the day. She has not sought treatment for this in the past, is looking into an online program.     Observations/Objective:  Alert and oriented. Appears well, not sickly. No distress. Speaking in complete sentences.   Assessment and Plan:  Ready to quit drinking alcohol nightly out of habit. Discussed to work on changing her night routine, finding ways to occupy  her time and mind. Referral placed for therapy to assist with her goals. She is agreeable to this plan and does not wish to take medication.   Follow Up Instructions:  You will be contacted regarding your referral to therapy.  Please let us know if you have not been contacted within one week.   It was a pleasure to see you today! Allie Bossier, NP-C    I discussed the assessment and treatment plan with the patient. The patient was provided an opportunity to ask questions and all were answered. The patient agreed with the plan and demonstrated an understanding of the instructions.   The patient was advised to call back or seek an in-person evaluation if the symptoms worsen or if the condition fails to improve as anticipated.     Pleas Koch, NP    Review of Systems  Constitutional: Negative for unexpected weight change.  Gastrointestinal: Negative for abdominal pain.  Neurological: Negative for dizziness and headaches.  Psychiatric/Behavioral: The patient is not nervous/anxious.        Past Medical History:  Diagnosis Date  . Abnormal Pap smear of cervix   . Accessory ureter   . 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 Anxiety    tapered off sertraline, stable.  . Fibroids 12/06/2011   Ultrasound showed: 8.8 x 4.6 x 5.6 cm uterus. 2.0 cm intramural fibroid in the uterine fundus, second intramural 1.7 cm fibroid in the right anterior upper corpus. EM  5 mm, no focal lesion. Normal ovaries.   Marland Kitchen GERD (gastroesophageal reflux disease)   . Hay fever   . Herpes labialis    acid foods and stress incite  . History of gestational diabetes   . HLD (hyperlipidemia)   . HTN (hypertension)   . Obesity   . Uterine polyp   . Vaginal cyst      Social History   Socioeconomic History  . Marital status: Single    Spouse name: Not on file  . Number of children: Not on file  . Years of education: Not on file  . Highest education level: Not on file   Occupational History  . Not on file  Social Needs  . Financial resource strain: Not on file  . Food insecurity    Worry: Not on file    Inability: Not on file  . Transportation needs    Medical: Not on file    Non-medical: Not on file  Tobacco Use  . Smoking status: Former Research scientist (life sciences)  . Smokeless tobacco: Never Used  . Tobacco comment: was social smoker only  Substance and Sexual Activity  . Alcohol use: Yes    Comment: 2 glasses red wine nightly  . Drug use: No  . Sexual activity: Not Currently    Partners: Male    Birth control/protection: Condom, Injection    Comment: depo povera  Lifestyle  . Physical activity    Days per week: Not on file    Minutes per session: Not on file  . Stress: Not on file  Relationships  . Social Herbalist on phone: Not on file    Gets together: Not on file    Attends religious service: Not on file    Active member of club or organization: Not on file    Attends meetings of clubs or organizations: Not on file    Relationship status: Not on file  . Intimate partner violence    Fear of current or ex partner: Not on file    Emotionally abused: Not on file    Physically abused: Not on file    Forced sexual activity: Not on file  Other Topics Concern  . Not on file  Social History Narrative   Caffeine: 1 cup coffee/day   Lives with son (1998), 2 dogs   Occupation: Therapist, sports at [employer], working on [employer]   Activity: gym 2-3x/wk, cardio, back in school   Diet: not lots of fruits.  Some water.    Past Surgical History:  Procedure Laterality Date  . DILATION AND CURETTAGE OF UTERUS  2007  . TONSILLECTOMY  1975  . TYMPANOSTOMY    . WISDOM TOOTH EXTRACTION      x4    Family History  Problem Relation Age of Onset  . Hypertension Mother   . Diabetes Mother   . Other Mother        NASH with varices  . Liver disease Mother        NASH, ITP  . Colon polyps Mother   . Coronary artery disease Father 36       massive MI  . Cancer  Father 55       lymphoma  . Hypertension Father   . Hyperlipidemia Father   . Diabetes Father   . Diabetes Brother   . Deep vein thrombosis Brother   . Colon cancer Maternal Uncle 63  . Stroke Maternal Grandmother        post  card cath  . Cancer Maternal Grandmother        leukemia and polycythemia  . Hypertension Maternal Grandmother   . Hypertension Maternal Grandfather   . Heart attack Maternal Grandfather   . Stroke Paternal Grandmother   . Hypertension Paternal Grandmother   . Stroke Paternal Grandfather   . Hypertension Paternal Grandfather   . Heart attack Paternal Grandfather   . Bladder Cancer Maternal Uncle   . Breast cancer Neg Hx   . Esophageal cancer Neg Hx   . Stomach cancer Neg Hx   . Rectal cancer Neg Hx     Allergies  Allergen Reactions  . Latex Rash  . Aleve [Naproxen Sodium]     Hot flashes  . Simvastatin Other (See Comments)    fatigue    Current Outpatient Medications on File Prior to Visit  Medication Sig Dispense Refill  . Acetaminophen (TYLENOL) 325 MG CAPS     . esomeprazole (NEXIUM) 20 MG capsule Take 20 mg by mouth daily at 12 noon.    . hydrochlorothiazide (HYDRODIURIL) 12.5 MG tablet Take 1 tablet (12.5 mg total) by mouth daily. For blood pressure 90 tablet 3  . Ibuprofen 200 MG CAPS     . levonorgestrel (MIRENA) 20 MCG/24HR IUD 1 each by Intrauterine route once. Inserted 03/2016    . losartan (COZAAR) 50 MG tablet Take 1 tablet (50 mg total) by mouth daily. For blood pressure 90 tablet 3  . Melatonin 5 MG CHEW     . valACYclovir (VALTREX) 1000 MG tablet TAKE 1 TABLET (1,000 MG TOTAL) BY MOUTH DAILY. (Patient taking differently: Take 1,000 mg by mouth as needed. ) 31 tablet 0   No current facility-administered medications on file prior to visit.     Ht 5\' 3"  (1.6 m)   Wt 216 lb (98 kg)   BMI 38.26 kg/m    Objective:   Physical Exam  Constitutional: She is oriented to person, place, and time. She appears well-nourished.   Respiratory: Effort normal.  Neurological: She is alert and oriented to person, place, and time.  Psychiatric: She has a normal mood and affect.           Assessment & Plan:

## 2018-11-06 NOTE — Patient Instructions (Signed)
You will be contacted regarding your referral to therapy.  Please let us know if you have not been contacted within one week.   It was a pleasure to see you today! Allie Bossier, NP-C

## 2018-11-06 NOTE — Telephone Encounter (Signed)
  Follow up Call-  Call back number 11/02/2018 10/24/2018  Post procedure Call Back phone  # [phone]  Permission to leave phone message Yes Yes  Some recent data might be hidden   [facility]

## 2018-11-07 ENCOUNTER — Telehealth: Payer: Self-pay | Admitting: Internal Medicine

## 2018-11-07 DIAGNOSIS — C801 Malignant (primary) neoplasm, unspecified: Secondary | ICD-10-CM

## 2018-11-07 NOTE — Telephone Encounter (Signed)
Patient has been scheduled for 11/13/18 for CT  At [facility].  She wanted this done at the earliest appt and preferred to go to the hospital.  She will pick up her contrast and instructions from [facility] at her convenience. Follow up arranged for October

## 2018-11-07 NOTE — Progress Notes (Signed)
See phone note but I reviewed results with patient no signs of cancer here I favor these are reactive post cautery changes at the polypectomy site  No letter needed  Place colonoscopy recall 1 year

## 2018-11-07 NOTE — Telephone Encounter (Signed)
See other phone note from today for details

## 2018-11-07 NOTE — Telephone Encounter (Signed)
See the question from the patient

## 2018-11-07 NOTE — Telephone Encounter (Signed)
I called the patient and explained that it is the belief of me and the GI tumor board that her cancer is cured by endoscopic resection.  Biopsies at the polypectomy site showed some focal atypia which I think is reactive there is no sign of cancer.  She needs the following:  #1 CT chest abdomen pelvis with contrast diagnosis adenocarcinoma of the rectosigmoid colon evaluate for metastases status post resection  #2 schedule next available office visit regarding fatty liver/[place]

## 2018-11-13 ENCOUNTER — Ambulatory Visit (HOSPITAL_COMMUNITY)
Admission: RE | Admit: 2018-11-13 | Discharge: 2018-11-13 | Disposition: A | Discharge status: Home / Self Care | Payer: 59 | Source: Ambulatory Visit | Attending: Internal Medicine | Admitting: Internal Medicine

## 2018-11-13 ENCOUNTER — Other Ambulatory Visit: Payer: Self-pay

## 2018-11-13 DIAGNOSIS — C801 Malignant (primary) neoplasm, unspecified: Secondary | ICD-10-CM | POA: Diagnosis not present

## 2018-11-13 DIAGNOSIS — K760 Fatty (change of) liver, not elsewhere classified: Secondary | ICD-10-CM | POA: Diagnosis not present

## 2018-11-13 DIAGNOSIS — K5730 Diverticulosis of large intestine without perforation or abscess without bleeding: Secondary | ICD-10-CM | POA: Diagnosis not present

## 2018-11-13 MED ORDER — IOHEXOL 300 MG/ML  SOLN
100.0000 mL | Freq: Once | INTRAMUSCULAR | Status: AC | PRN
  Administered 2018-11-13: 100 mL via INTRAVENOUS

## 2018-11-13 MED ORDER — SODIUM CHLORIDE (PF) 0.9 % IJ SOLN
INTRAMUSCULAR | Status: AC
  Filled 2018-11-13: qty 50

## 2018-11-13 NOTE — Progress Notes (Signed)
Colleen Boone  No signs of cancer anywhere.  Liver has increased fat but looks ok otherwise  Will review more at 10/9 follow-up.  Take care,  Gatha Mayer, MD, [facility]

## 2018-11-14 MED FILL — HYDROCHLOROTHIAZIDE 12.5 MG: 12.5 | 90 days supply | Qty: 90 | Fill #1

## 2018-11-27 ENCOUNTER — Ambulatory Visit
Admission: RE | Admit: 2018-11-27 | Discharge: 2018-11-27 | Disposition: A | Discharge status: Home / Self Care | Payer: 59 | Source: Ambulatory Visit | Attending: Primary Care | Admitting: Primary Care

## 2018-11-27 ENCOUNTER — Other Ambulatory Visit: Payer: Self-pay

## 2018-11-27 DIAGNOSIS — Z1231 Encounter for screening mammogram for malignant neoplasm of breast: Secondary | ICD-10-CM | POA: Diagnosis not present

## 2018-11-27 DIAGNOSIS — Z1239 Encounter for other screening for malignant neoplasm of breast: Secondary | ICD-10-CM

## 2018-12-03 MED FILL — LOSARTAN POTASSIUM 50 MG TA: 50 | 90 days supply | Qty: 90 | Fill #2

## 2018-12-14 ENCOUNTER — Ambulatory Visit: Payer: 59 | Admitting: Internal Medicine

## 2018-12-18 MED FILL — LOSARTAN POTASSIUM 50 MG TA: 50 | 90 days supply | Qty: 90 | Fill #2

## 2019-01-11 ENCOUNTER — Encounter: Payer: Self-pay | Admitting: Internal Medicine

## 2019-01-11 ENCOUNTER — Other Ambulatory Visit: Payer: Self-pay

## 2019-01-11 ENCOUNTER — Ambulatory Visit: Payer: 59 | Admitting: Internal Medicine

## 2019-01-11 VITALS — BP 130/84 | HR 80 | Temp 98.3°F | Ht 62.75 in | Wt 204.4 lb

## 2019-01-11 DIAGNOSIS — K760 Fatty (change of) liver, not elsewhere classified: Secondary | ICD-10-CM

## 2019-01-11 DIAGNOSIS — F439 Reaction to severe stress, unspecified: Secondary | ICD-10-CM

## 2019-01-11 DIAGNOSIS — F1010 Alcohol abuse, uncomplicated: Secondary | ICD-10-CM | POA: Diagnosis not present

## 2019-01-11 DIAGNOSIS — Z23 Encounter for immunization: Secondary | ICD-10-CM

## 2019-01-11 DIAGNOSIS — Z6836 Body mass index (BMI) 36.0-36.9, adult: Secondary | ICD-10-CM | POA: Diagnosis not present

## 2019-01-11 NOTE — Progress Notes (Signed)
Colleen Boone 48 y.o. [birth year] [id number]  Assessment & Plan:    NAFLD (nonalcoholic fatty liver disease) Fibrosis 4 score 0.95 so favorable Needs to lose weight and stop regular alcohol consumption Nutrition advice re: low carb given She has plan to reduce/stop EtOH Recheck LFT's 2/1/201 HAV vaccination  Class 2 severe obesity due to excess calories with serious comorbidity and body mass index (BMI) of 36.0 to 36.9 in adult ([place]) Low carb diet Not low fat as rx at bariatric clinic she went to - I advised her to stop going there Phentermine short-term using   [email or web address]   Situational stress School Sibling ill Covid Needs to use something other than alcohol to cope Says she is not one for a counselor  Alcohol abuse, daily use She is aware of health risk and indicates desire to stop Too much stress right now Aiming for early Jan to start reducing/stop  I appreciate the opportunity to care for you.  [id number], Leticia Penna, NP  Subjective:   Chief Complaint: fatty liver  HPI Patient is here to discuss her fatty liver and obesity.  She is losing weight on phentermine going to a bariatric clinic but they told her to go on a low-fat diet.  She continues to use alcohol daily understanding that that is not good for her but she has situational stress she is completing her masters degree, her brother had bypass surgery and overall life is stressful.  She says the alcohol is a habit and is a bit of a self-medication.  She says she is not someone that would do well with counseling.  She understands the need to quit.  Otherwise doing well, we also reviewed her colon cancer resection via colonoscopy back in August. Allergies  Allergen Reactions  . Latex Rash  . Aleve [Naproxen Sodium]     Hot flashes  . Zocor [Simvastatin] Other (See Comments)    fatigue   Current Meds  Medication Sig  . Acetaminophen (TYLENOL) 325 MG CAPS   . esomeprazole  (NEXIUM) 20 MG capsule Take 20 mg by mouth daily at 12 noon.  . hydrochlorothiazide (HYDRODIURIL) 12.5 MG tablet Take 1 tablet (12.5 mg total) by mouth daily. For blood pressure  . Ibuprofen 200 MG CAPS   . levonorgestrel (MIRENA) 20 MCG/24HR IUD 1 each by Intrauterine route once. Inserted 03/2016  . losartan (COZAAR) 50 MG tablet Take 1 tablet (50 mg total) by mouth daily. For blood pressure  . Melatonin 5 MG CHEW   . phentermine 37.5 MG capsule Take 37.5 mg by mouth every morning.  . valACYclovir (VALTREX) 1000 MG tablet TAKE 1 TABLET (1,000 MG TOTAL) BY MOUTH DAILY. (Patient taking differently: Take 1,000 mg by mouth as needed. )   Past Medical History:  Diagnosis Date  . Abnormal Pap smear of cervix   . Accessory ureter   . 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 Anxiety    tapered off sertraline, stable.  . Fibroids 12/06/2011   Ultrasound showed: 8.8 x 4.6 x 5.6 cm uterus. 2.0 cm intramural fibroid in the uterine fundus, second intramural 1.7 cm fibroid in the right anterior upper corpus. EM 5 mm, no focal lesion. Normal ovaries.   Marland Kitchen GERD (gastroesophageal reflux disease)   . Hay fever   . Herpes labialis    acid foods and stress incite  . History of gestational diabetes   . HLD (hyperlipidemia)   . HTN (hypertension)   . Obesity   . Uterine  polyp   . Vaginal cyst    Past Surgical History:  Procedure Laterality Date  . DILATION AND CURETTAGE OF UTERUS  2007  . TONSILLECTOMY  1975  . TYMPANOSTOMY    . WISDOM TOOTH EXTRACTION      x4   Social History   Social History Narrative   Caffeine: 1 cup coffee/day   Lives with son (1998), 2 dogs   Occupation: Therapist, sports at [employer], working on [employer]   Activity: gym 2-3x/wk, cardio, back in school   Diet: not lots of fruits.  Some water.   family history includes Bladder Cancer in her maternal uncle; Cancer in her maternal grandmother; Cancer (age of onset: 48) in her father; Colon cancer (age of onset: 40) in  her maternal uncle; Colon polyps in her mother; Coronary artery disease (age of onset: 63) in her father; Deep vein thrombosis in her brother; Diabetes in her brother, father, and mother; Heart attack in her maternal grandfather and paternal grandfather; Hyperlipidemia in her father; Hypertension in her father, maternal grandfather, maternal grandmother, mother, paternal grandfather, and paternal grandmother; Liver disease in her mother; Other in her mother; Stroke in her maternal grandmother, paternal grandfather, and paternal grandmother.   Review of Systems As above  Objective:   Physical Exam   25 minutes time spent with patient > half in counseling coordination of care

## 2019-01-11 NOTE — Patient Instructions (Addendum)
As we discussed  1) Stop going to that bariatric clinic 2) Use nutrition guidance below 3) Set a goal to reduce and eliminate alcohol 4) Come for labs Feb 1 (approximate) - put it in your phone calendar and reminder 5) message or call with ?  I appreciate the opportunity to care for you. Gatha Mayer, MD, [facility]  Healthy and nutritious eating and weight loss are made to be harder than they need to be. A simplified diet approach based around eating normally, as much as you want without restricting intake too much is better, I think. You must avoid packaged foods and try to eat real foods as much as possible. Packaged foods, sugary sodas, highly processed foods taste great but are slow poisons that lead to obesity and/or poor health.  It is very helpful to take some time each week and plan your meals. Work with your spouse, partner, family to do this as much as possible. Preparing meals ahead to take to work or school is especially helpful and will save money, too. You can do this and by working together it can take less time.  Some resources that I like are:  www.gaplesinstitute.org (Do the learning modules about healthy eating)  www.dietdoctor.com - helps with low carb diets and also can learn about and consider intermittent  fasting. If you have diabetes would not do intermittent fasting without checking with your doctor. Best to change what you eat before doing this.  Www.drberry.com   I recommend you check your blood sugar daily and keep a log. If you are diabetic and check sugars.  Here are some guidelines to help you with meal planning -  Avoid all processed and packaged foods (bread, pasta, crackers, chips, etc) and beverages containing calories.  Avoid added sugars and excessive natural sugars.  Attention to how you feel if you consume artificial sweeteners.  Do they make you more hungry or raise your blood sugar?  With every meal and snack, aim to get 20 g of protein (3 ounces  of meat, 4 ounces of fish, 3 eggs, protein powder, 1 cup [place] yogurt, 1 cup cottage cheese, etc.)  Increase fiber in the form of non-starchy vegetables.  These help you feel full with very little carbohydrates and are good for gut health.  Eat 1 serving healthy carb per meal- 1/2 cup brown rice, beans, potato, corn- pay attention to whether or not this significantly raises your blood sugar if you are checking blood sugars. If it does, reduce the frequency you consume these.   Eat 2-3 servings of lower sugar fruits daily.  This includes berries, apples, oranges, peaches, pears, one half banana.  Have small amounts of good fats such as avocado, nuts, olive oil, nut butters, olives.  Add a little cheese to your salads to make them tasty.   Today you have been given a Hepatitis A vaccine. Your next shot will be done early May of 2021 to complete the series.  I appreciate the opportunity to care for you. Silvano Rusk, MD, [facility]

## 2019-01-18 DIAGNOSIS — Z6836 Body mass index (BMI) 36.0-36.9, adult: Secondary | ICD-10-CM | POA: Insufficient documentation

## 2019-01-18 DIAGNOSIS — E6609 Other obesity due to excess calories: Secondary | ICD-10-CM | POA: Insufficient documentation

## 2019-01-18 DIAGNOSIS — F439 Reaction to severe stress, unspecified: Secondary | ICD-10-CM | POA: Insufficient documentation

## 2019-01-18 NOTE — Assessment & Plan Note (Addendum)
Fibrosis 4 score 0.95 so favorable Needs to lose weight and stop regular alcohol consumption Nutrition advice re: low carb given She has plan to reduce/stop EtOH Recheck LFT's 2/1/201 HAV vaccination

## 2019-01-18 NOTE — Assessment & Plan Note (Signed)
Low carb diet Not low fat as rx at bariatric clinic she went to - I advised her to stop going there Phentermine short-term using   [email or web address]

## 2019-01-18 NOTE — Assessment & Plan Note (Signed)
School Sibling ill Covid Needs to use something other than alcohol to cope Says she is not one for a counselor

## 2019-01-18 NOTE — Assessment & Plan Note (Signed)
She is aware of health risk and indicates desire to stop Too much stress right now Aiming for early Jan to start reducing/stop

## 2019-01-27 IMAGING — MR MR ABDOMEN WO/W CM
10 of 18 series · 22 of 48 positions shown · IV contrast (multihance)
Comparison: Ultrasound on 03/14/2017

CLINICAL DATA: Elevated liver function tests. Abnormal liver
ultrasound with possible thrombosed vessel in left hepatic lobe.

EXAM:
MRI ABDOMEN WITHOUT AND WITH CONTRAST
TECHNIQUE: Multiplanar multisequence MR imaging of the abdomen was performed
both before and after the administration of intravenous contrast.
CONTRAST:  20mL MULTIHANCE GADOBENATE DIMEGLUMINE 529 MG/ML IV SOLN

[Series 3: T2 fat-sat · axial · 5.0mm · 0.82mm/px · 1 of 55 slices shown]
[im 1/55]
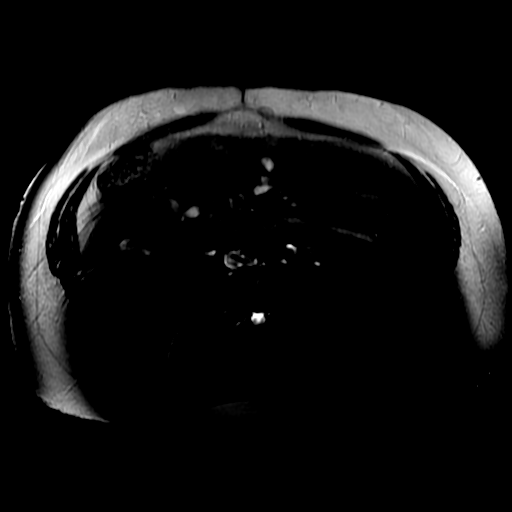

[Series 4: DWI b500 · axial · 6.0mm · 1.48mm/px · z∈[-44,+253]mm · 3 of 78 slices shown]
[im 1/78]
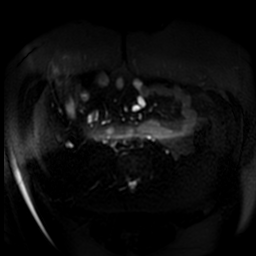
[im 39/78]
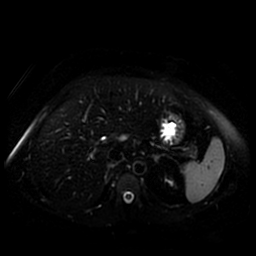
[im 78/78]
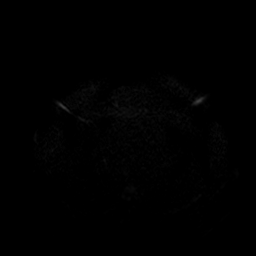

[Series 5: ax dualecho · axial · 5.0mm · 0.78mm/px · z∈[-35,+240]mm · 4 of 112 slices shown]
[im 1/112]
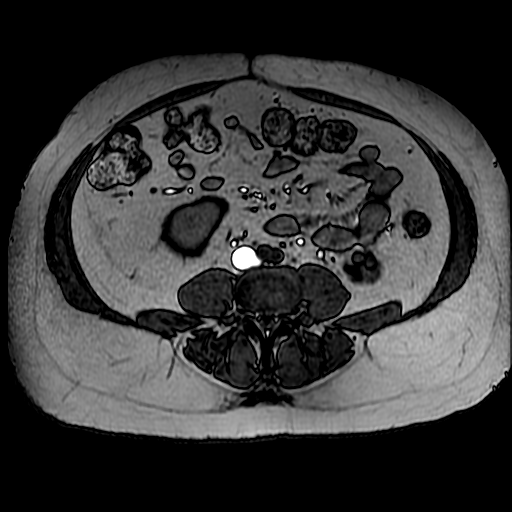
[im 38/112]
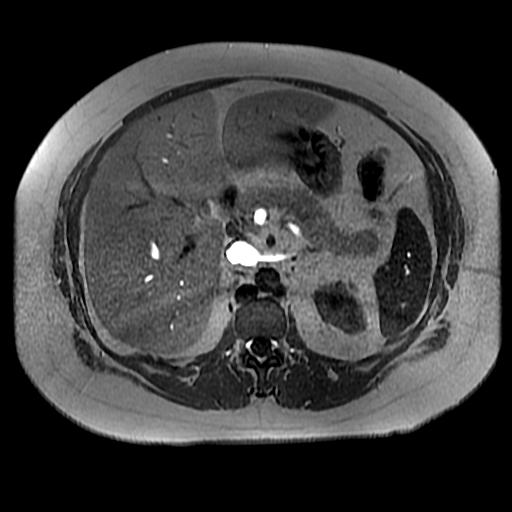
[im 75/112]
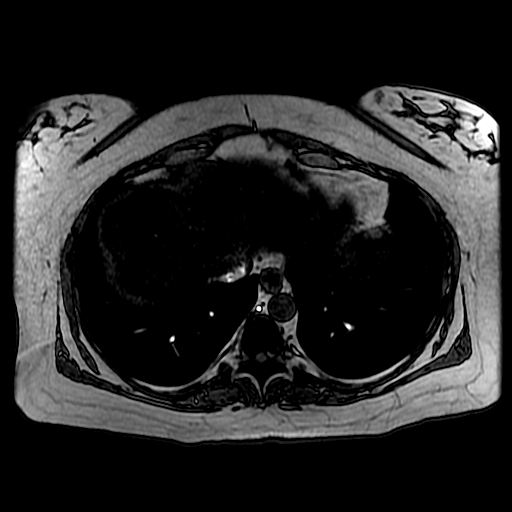
[im 112/112]
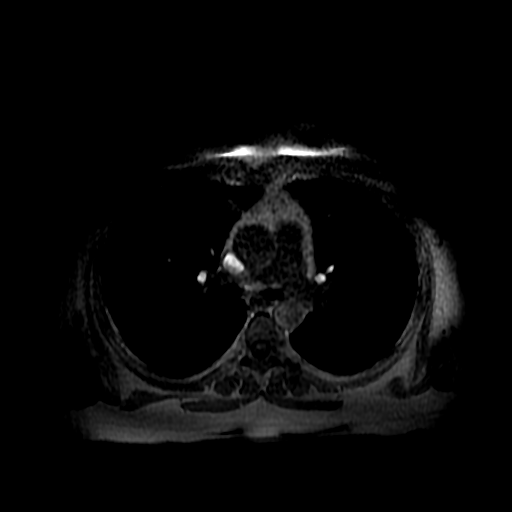

[Series 6: T2 · axial · 5.0mm · 0.78mm/px · z∈[-35,+240]mm · 2 of 56 slices shown (1 of 2)]
[im 1/56]
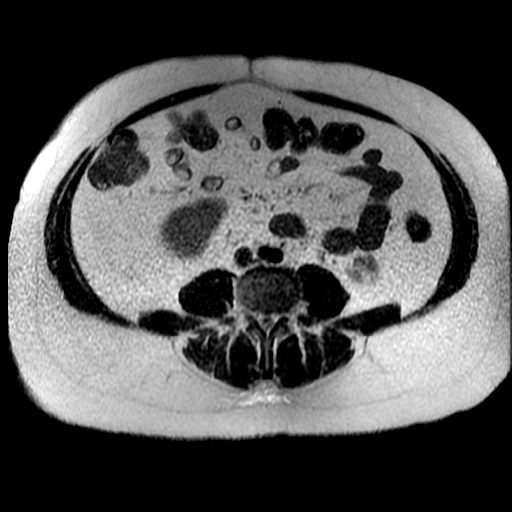
[im 56/56]
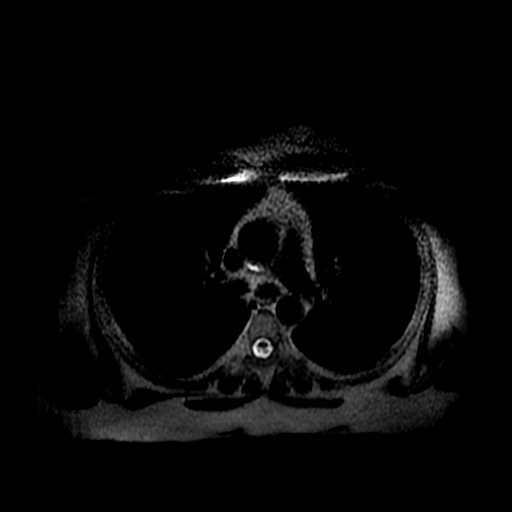

[Series 7: T2 · coronal · 5.0mm · 0.78mm/px · 2 of 53 slices shown (2 of 2)]
[im 1/53]
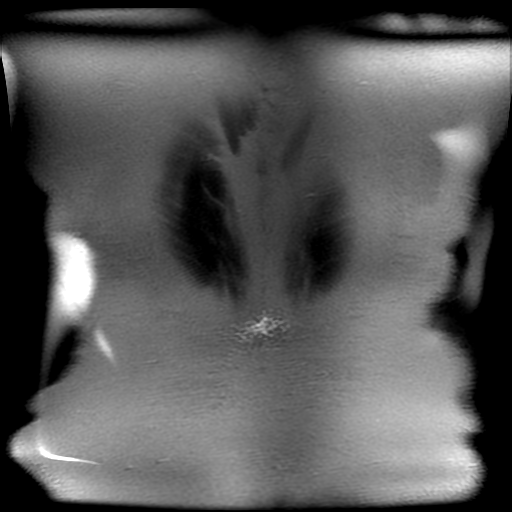
[im 53/53]
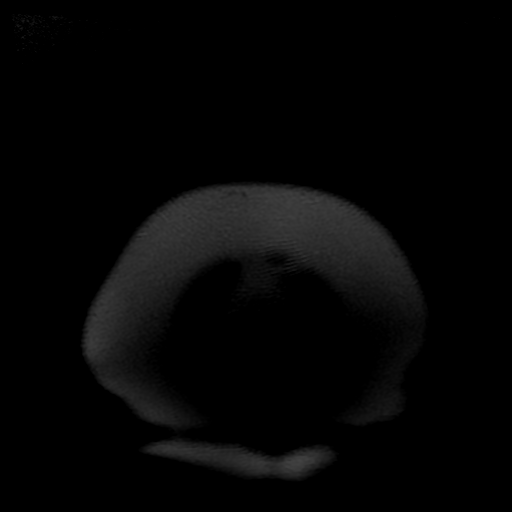

[Series 8: bSSFP · axial · 5.0mm · 0.78mm/px · z∈[-35,+240]mm · 2 of 56 slices shown]
[im 1/56]
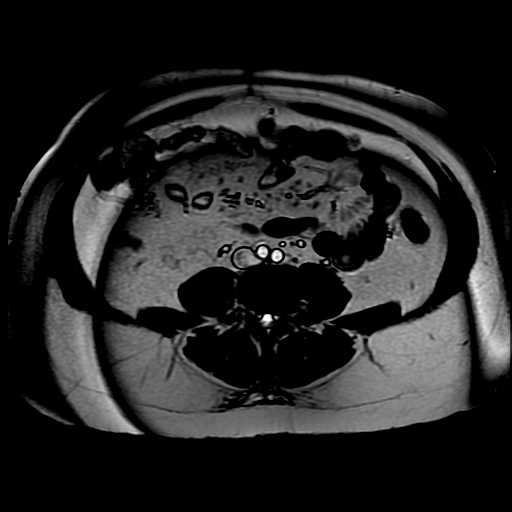
[im 56/56]
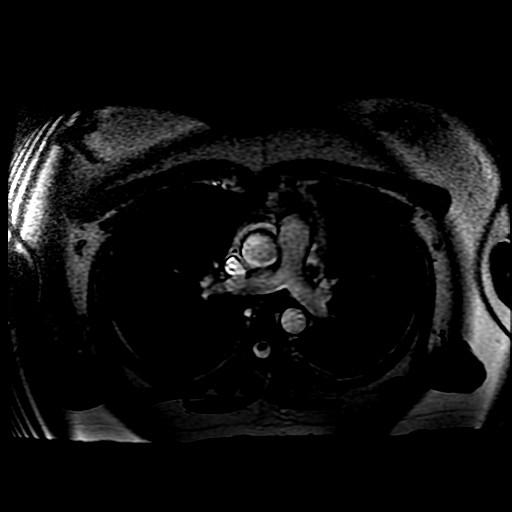

[Series 400: DWI · axial · 6.0mm · 1.48mm/px · 1 of 39 slices shown]
[im 1/39]
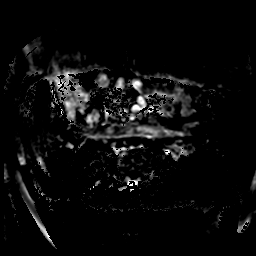

[Series 900: T1 dynamic · axial · 6.0mm · 0.78mm/px · z∈[-25,+236]mm · 3 of 88 slices shown (1 of 3)]
[im 1/88]
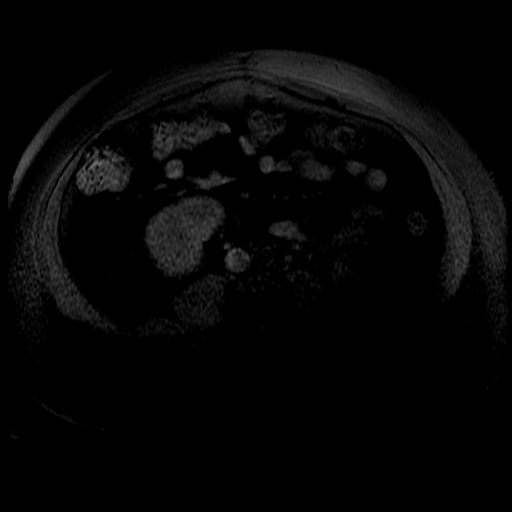
[im 44/88]
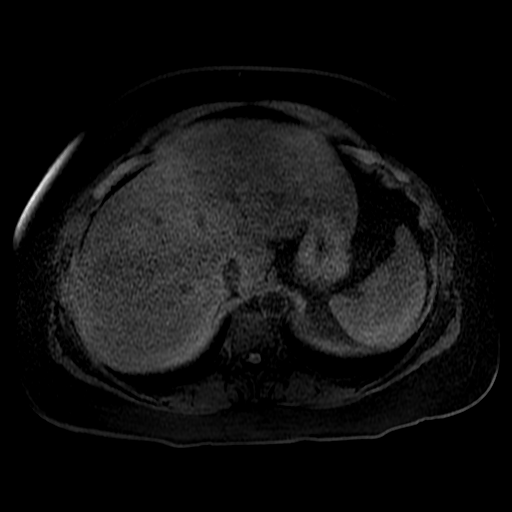
[im 88/88]
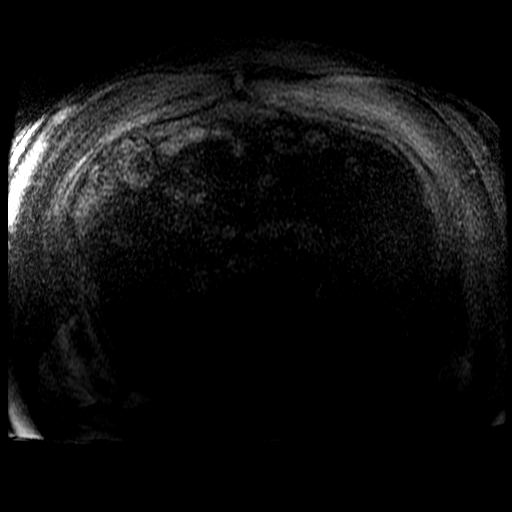

[Series 901: T1 dynamic · axial · 6.0mm · 0.78mm/px · z∈[-25,+236]mm · 3 of 88 slices shown (2 of 3)]
[im 1/88]
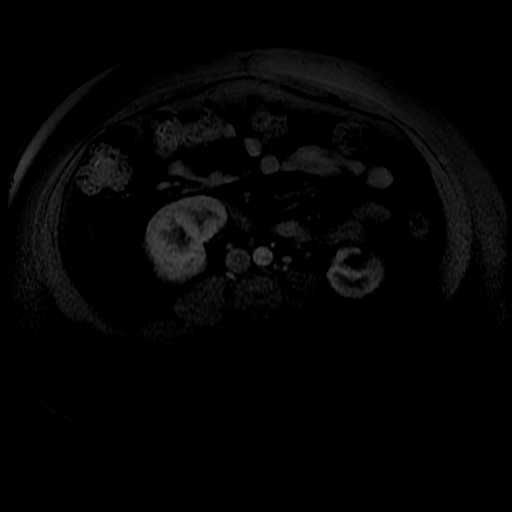
[im 44/88]
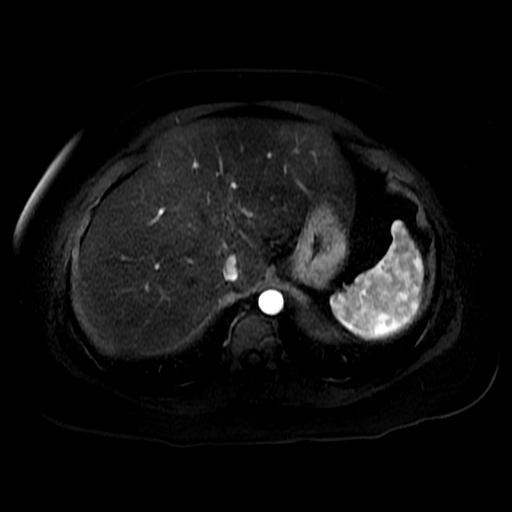
[im 88/88]
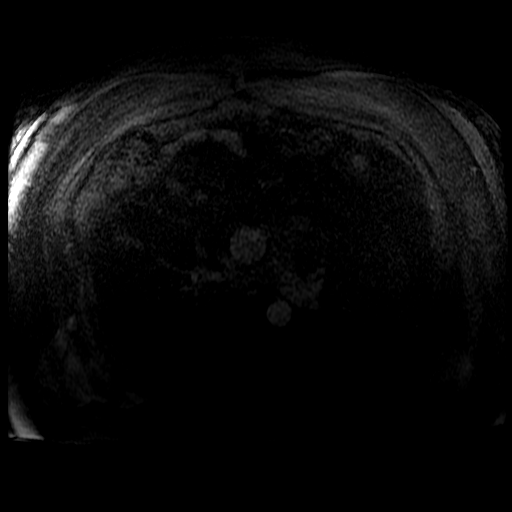

[Series 902: T1 dynamic · axial · 6.0mm · 0.78mm/px · 1 of 88 slices shown (3 of 3)]
[im 1/88]
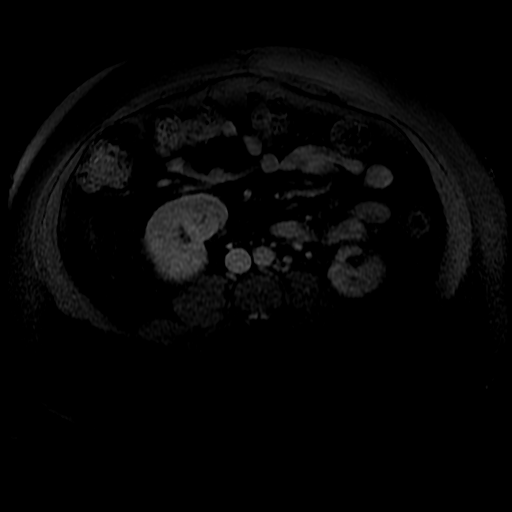

[22 of 48 positions shown; findings below may reference images not displayed]

FINDINGS: Lower chest: No acute findings.

Hepatobiliary: Severe diffuse hepatic steatosis. No gross
morphologic changes of hepatic cirrhosis seen. No hepatic masses
identified. No evidence of portal or hepatic vein thrombus.

Pancreas:  No mass or inflammatory changes.

Spleen:  Within normal limits in size and appearance.

Adrenals/Urinary Tract: No masses identified. Mild left renal
parenchymal scarring. No evidence of hydronephrosis.

Stomach/Bowel: Visualized abdominal bowel unremarkable.

Vascular/Lymphatic: No pathologically enlarged lymph nodes
identified. No abdominal aortic aneurysm.

Other:  None.

Musculoskeletal:  No suspicious bone lesions identified.
IMPRESSION: Severe diffuse hepatic steatosis.

No evidence of hepatic neoplasm, vascular thrombosis, or other
significant abnormality.

## 2019-02-07 MED FILL — HYDROCHLOROTHIAZIDE 12.5 MG: 12.5 | 90 days supply | Qty: 90 | Fill #2

## 2019-03-22 MED FILL — LOSARTAN POTASSIUM 50 MG TA: 50 | 90 days supply | Qty: 90 | Fill #3

## 2019-05-07 ENCOUNTER — Other Ambulatory Visit: Payer: Self-pay | Admitting: Primary Care

## 2019-05-07 DIAGNOSIS — I10 Essential (primary) hypertension: Secondary | ICD-10-CM

## 2019-05-07 MED FILL — HYDROCHLOROTHIAZIDE 12.5 MG: 12.5 | 30 days supply | Qty: 30 | Fill #0

## 2019-05-08 ENCOUNTER — Telehealth: Payer: Self-pay

## 2019-05-08 DIAGNOSIS — C19 Malignant neoplasm of rectosigmoid junction: Secondary | ICD-10-CM

## 2019-05-08 DIAGNOSIS — C801 Malignant (primary) neoplasm, unspecified: Secondary | ICD-10-CM

## 2019-05-08 NOTE — Telephone Encounter (Signed)
Order has been placed for genetic counselor and they contact the patient.

## 2019-05-08 NOTE — Telephone Encounter (Signed)
-----   Message from Gatha Mayer, MD sent at 05/08/2019 11:28 AM EST ----- Regarding: genetics referral Please refer to genetics counselor re: colorectal cancer  See My Chart message for details

## 2019-05-10 DIAGNOSIS — L918 Other hypertrophic disorders of the skin: Secondary | ICD-10-CM | POA: Diagnosis not present

## 2019-05-10 DIAGNOSIS — I8393 Asymptomatic varicose veins of bilateral lower extremities: Secondary | ICD-10-CM | POA: Diagnosis not present

## 2019-05-30 ENCOUNTER — Other Ambulatory Visit: Payer: Self-pay | Admitting: Genetic Counselor

## 2019-05-30 ENCOUNTER — Inpatient Hospital Stay: Payer: 59 | Attending: Genetic Counselor | Admitting: Genetic Counselor

## 2019-05-30 DIAGNOSIS — C19 Malignant neoplasm of rectosigmoid junction: Secondary | ICD-10-CM

## 2019-05-30 DIAGNOSIS — Z8052 Family history of malignant neoplasm of bladder: Secondary | ICD-10-CM

## 2019-05-30 DIAGNOSIS — Z800 Family history of malignant neoplasm of digestive organs: Secondary | ICD-10-CM

## 2019-05-30 NOTE — Progress Notes (Signed)
genetic

## 2019-05-31 ENCOUNTER — Other Ambulatory Visit: Payer: Self-pay

## 2019-05-31 ENCOUNTER — Encounter: Payer: Self-pay | Admitting: Genetic Counselor

## 2019-05-31 ENCOUNTER — Other Ambulatory Visit: Payer: 59

## 2019-05-31 ENCOUNTER — Inpatient Hospital Stay: Payer: 59

## 2019-05-31 DIAGNOSIS — Z803 Family history of malignant neoplasm of breast: Secondary | ICD-10-CM | POA: Diagnosis not present

## 2019-05-31 DIAGNOSIS — C19 Malignant neoplasm of rectosigmoid junction: Secondary | ICD-10-CM

## 2019-05-31 DIAGNOSIS — Z800 Family history of malignant neoplasm of digestive organs: Secondary | ICD-10-CM | POA: Insufficient documentation

## 2019-05-31 DIAGNOSIS — Z8052 Family history of malignant neoplasm of bladder: Secondary | ICD-10-CM | POA: Insufficient documentation

## 2019-05-31 LAB — GENETIC SCREENING ORDER

## 2019-05-31 NOTE — Progress Notes (Signed)
REFERRING PROVIDER: Gatha Mayer, MD [address],  [place] [address]  PRIMARY PROVIDER:  Pleas Koch, NP  PRIMARY REASON FOR VISIT:  1. 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 Family history of colon cancer   3. Family history of bladder cancer      HISTORY OF PRESENT ILLNESS:   Colleen Boone, a 49 y.o. female, was seen for a [facility] cancer genetics consultation at the request of Dr. Carlean Purl due to a personal and family history of colon cancer and family history of other cancer.  Colleen Boone presents to clinic today to discuss the possibility of a hereditary predisposition to cancer, genetic testing, and to further clarify her future cancer risks, as well as potential cancer risks for family members.   In 2020, at the age of 77, Colleen Boone was diagnosed with cancer of the rectoid-sigmoid junction. The treatment plan included removal of the polyp, and no other treatment was needed.. MSI/IHC was performed and was negative.     CANCER HISTORY:  Oncology History   No history exists.     RISK FACTORS:  Menarche was at age 48.  First live birth at age 76.   Ovaries intact: yes.  Hysterectomy: no.  Menopausal status: premenopausal.  HRT use: 0 years. Colonoscopy: yes; abnormal. Mammogram within the last year: yes. Number of breast biopsies: 0. Up to date with pelvic exams: yes. Any excessive radiation exposure in the past: no  Past Medical History:  Diagnosis Date  . Abnormal Pap smear of cervix   . Accessory ureter   . 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 Anxiety    tapered off sertraline, stable.  . Family history of bladder cancer   . Family history of colon cancer   . Fibroids 12/06/2011   Ultrasound showed: 8.8 x 4.6 x 5.6 cm uterus. 2.0 cm intramural fibroid in the uterine fundus, second intramural 1.7 cm fibroid in the right anterior upper corpus. EM 5 mm, no focal lesion. Normal ovaries.    Marland Kitchen GERD (gastroesophageal reflux disease)   . Hay fever   . Herpes labialis    acid foods and stress incite  . History of gestational diabetes   . HLD (hyperlipidemia)   . HTN (hypertension)   . Obesity   . Uterine polyp   . Vaginal cyst     Past Surgical History:  Procedure Laterality Date  . DILATION AND CURETTAGE OF UTERUS  2007  . TONSILLECTOMY  1975  . TYMPANOSTOMY    . WISDOM TOOTH EXTRACTION      x4    Social History   Socioeconomic History  . Marital status: Single    Spouse name: Not on file  . Number of children: Not on file  . Years of education: Not on file  . Highest education level: Not on file  Occupational History  . Not on file  Tobacco Use  . Smoking status: Former Research scientist (life sciences)  . Smokeless tobacco: Never Used  . Tobacco comment: was social smoker only  Substance and Sexual Activity  . Alcohol use: Yes    Comment: 2 glasses red wine nightly  . Drug use: No  . Sexual activity: Not Currently    Partners: Male    Birth control/protection: Condom, Injection    Comment: depo povera  Other Topics Concern  . Not on file  Social History Narrative   Caffeine: 1 cup coffee/day   Lives with son (1998), 2  dogs   Occupation: Therapist, sports at [employer], working on [employer]   Activity: gym 2-3x/wk, cardio, back in school   Diet: not lots of fruits.  Some water.   Social Determinants of Health   Financial Resource Strain:   . Difficulty of Paying Living Expenses:   Food Insecurity:   . Worried About Charity fundraiser in the Last Year:   . Arboriculturist in the Last Year:   Transportation Needs:   . Film/video editor (Medical):   Marland Kitchen Lack of Transportation (Non-Medical):   Physical Activity:   . Days of Exercise per Week:   . Minutes of Exercise per Session:   Stress:   . Feeling of Stress :   Social Connections:   . Frequency of Communication with Friends and Family:   . Frequency of Social Gatherings with Friends and Family:   . Attends Religious  Services:   . Active Member of Clubs or Organizations:   . Attends Archivist Meetings:   Marland Kitchen Marital Status:      FAMILY HISTORY:  We obtained a detailed, 4-generation family history.  Significant diagnoses are listed below: Family History  Problem Relation Age of Onset  . Hypertension Mother   . Diabetes Mother   . Other Mother        NASH with varices  . Liver disease Mother        NASH, ITP  . Colon polyps Mother   . Coronary artery disease Father 15       massive MI  . Cancer Father 37       lymphoma  . Hypertension Father   . Hyperlipidemia Father   . Diabetes Father   . Diabetes Brother   . Deep vein thrombosis Brother   . Colon cancer Maternal Uncle 50  . Stroke Maternal Grandmother        post card cath  . Cancer Maternal Grandmother        leukemia and polycythemia  . Hypertension Maternal Grandmother   . Hypertension Maternal Grandfather   . Heart attack Maternal Grandfather   . Stroke Paternal Grandmother   . Hypertension Paternal Grandmother   . Stroke Paternal Grandfather   . Hypertension Paternal Grandfather   . Heart attack Paternal Grandfather   . Bladder Cancer Maternal Uncle   . Other Sister        MVA  . Breast cancer Neg Hx   . Esophageal cancer Neg Hx   . Stomach cancer Neg Hx   . Rectal cancer Neg Hx     The patient has one son who is cancer free.  She has a brother and sister, her sister died at 76 from a car accident.  Her brother does not have cancer.  Both parents are deceased.  The patient's father had lymphoma at 7.  He has three sisters and two brothers, all whom are cancer free.  There is no reported family history of cancer on the paternal side of the family.  The patient's mother died from complications of NASH. She had two brothers and a sister.  One brother had colon cancer and one brother had bladder cancer.  The sister had a son who had an unknown cancer. The grandparents are deceased, the grandmother had  leukemia.  Colleen Boone is unaware of previous family history of genetic testing for hereditary cancer risks. Patient's maternal ancestors are of Caucasian descent, and paternal ancestors are of Caucasian descent. There is no reported  Ashkenazi Jewish ancestry. There is no known consanguinity.  GENETIC COUNSELING ASSESSMENT: Colleen Boone is a 49 y.o. female with a personal and family history of colon cancer and family history of other cancer which is somewhat suggestive of a hereditary cancer syndrome and predisposition to cancer given her young age of onset. We, therefore, discussed and recommended the following at today's visit.   DISCUSSION: We discussed that 5 - 7% of colon cancer is hereditary, with most cases associated with Lynch syndrome.  There are other genes that can be associated with hereditary colon cancer syndromes.  Testing of the tumor was performed by MSI and IHC, which was negative.  We discussed that testing is beneficial for several reasons including knowing how to follow individuals after completing their treatment, identifying whether potential treatment options such as PARP inhibitors would be beneficial, and understand if other family members could be at risk for cancer and allow them to undergo genetic testing.   We reviewed the characteristics, features and inheritance patterns of hereditary cancer syndromes. We also discussed genetic testing, including the appropriate family members to test, the process of testing, insurance coverage and turn-around-time for results. We discussed the implications of a negative, positive, carrier and/or variant of uncertain significant result. We recommended Colleen Boone pursue genetic testing for the CancerNext-Expanded+RNAinsight gene panel. The CancerNext-Expanded gene panel offered by [facility] and includes sequencing and rearrangement analysis for the following 67 genes: AIP, ALK, APC, ATM, BAP1, BARD1, BLM, BMPR1A, BRCA1, BRCA2, BRIP1,  CDH1, CDK4, CDKN1B, CDKN2A, CHEK2, DICER1, EPCAM, FANCC, FH, FLCN, GALNT12, GREM1, HOXB13, MAX, MEN1, MET, MITF, MLH1, MRE11A, MSH2, MSH6, MUTYH, NBN, NF1, NF2, PALB2, PHOX2B, PMS2, POLD1, POLE, POT1, PRKAR1A, PTCH1, PTEN, RAD50, RAD51D, RB1, RET, SDHA, SDHAF2, SDHB, SDHC, SDHD, SMAD4, SMARCA4, SMARCB1, STK11, SUFU, TMEM127, TP53, TSC1, TSC2, VHL and XRCC2.   Based on Colleen Boone's personal and family history of cancer, she meets medical criteria for genetic testing. Despite that she meets criteria, she may still have an out of pocket cost. We discussed that if her out of pocket cost for testing is over $100, the laboratory will call and confirm whether she wants to proceed with testing.  If the out of pocket cost of testing is less than $100 she will be billed by the genetic testing laboratory.   PLAN: After considering the risks, benefits, and limitations, Colleen Boone provided informed consent to pursue genetic testing and the blood sample was sent to [facility] for analysis of the CancerNext-expanded+RNAinsight panel. Results should be available within approximately 2-3 weeks' time, at which point they will be disclosed by telephone to Colleen Boone, as will any additional recommendations warranted by these results. Colleen Boone will receive a summary of her genetic counseling visit and a copy of her results once available. This information will also be available in Epic.   Lastly, we encouraged Colleen Boone to remain in contact with cancer genetics annually so that we can continuously update the family history and inform her of any changes in cancer genetics and testing that may be of benefit for this family.   Colleen Boone questions were answered to her satisfaction today. Our contact information was provided should additional questions or concerns arise. Thank you for the referral and allowing [place] to share in the care of your patient.   Colleen Boone P. Florene Glen, [place], [facility] Licensed, Geophysical data processor [email or web address] phone: [phone]  The patient was seen for a total of 45 minutes in face-to-face genetic counseling.  This patient  was discussed with Drs. Magrinat, Lindi Adie and/or Burr Medico who agrees with the above.    _______________________________________________________________________ For Office Staff:  Number of people involved in session: 1 Was an Intern/ student involved with case: no

## 2019-06-05 ENCOUNTER — Ambulatory Visit: Payer: 59 | Admitting: Primary Care

## 2019-06-05 ENCOUNTER — Other Ambulatory Visit: Payer: Self-pay

## 2019-06-05 ENCOUNTER — Encounter: Payer: Self-pay | Admitting: Primary Care

## 2019-06-05 VITALS — BP 118/82 | HR 91 | Temp 96.3°F | Ht 62.75 in | Wt 201.2 lb

## 2019-06-05 DIAGNOSIS — C19 Malignant neoplasm of rectosigmoid junction: Secondary | ICD-10-CM | POA: Diagnosis not present

## 2019-06-05 DIAGNOSIS — F1010 Alcohol abuse, uncomplicated: Secondary | ICD-10-CM

## 2019-06-05 DIAGNOSIS — K219 Gastro-esophageal reflux disease without esophagitis: Secondary | ICD-10-CM

## 2019-06-05 DIAGNOSIS — I10 Essential (primary) hypertension: Secondary | ICD-10-CM | POA: Diagnosis not present

## 2019-06-05 DIAGNOSIS — F411 Generalized anxiety disorder: Secondary | ICD-10-CM

## 2019-06-05 DIAGNOSIS — E785 Hyperlipidemia, unspecified: Secondary | ICD-10-CM

## 2019-06-05 DIAGNOSIS — R7303 Prediabetes: Secondary | ICD-10-CM

## 2019-06-05 DIAGNOSIS — K760 Fatty (change of) liver, not elsewhere classified: Secondary | ICD-10-CM

## 2019-06-05 DIAGNOSIS — R7989 Other specified abnormal findings of blood chemistry: Secondary | ICD-10-CM | POA: Diagnosis not present

## 2019-06-05 DIAGNOSIS — B009 Herpesviral infection, unspecified: Secondary | ICD-10-CM | POA: Diagnosis not present

## 2019-06-05 LAB — COMPREHENSIVE METABOLIC PANEL
ALT: 38 U/L — ABNORMAL HIGH (ref 0–35)
AST: 31 U/L (ref 0–37)
Albumin: 4.2 g/dL (ref 3.5–5.2)
Alkaline Phosphatase: 77 U/L (ref 39–117)
BUN: 15 mg/dL (ref 6–23)
CO2: 28 mEq/L (ref 19–32)
Calcium: 9.4 mg/dL (ref 8.4–10.5)
Chloride: 99 mEq/L (ref 96–112)
Creatinine, Ser: 0.81 mg/dL (ref 0.40–1.20)
GFR: 75.08 mL/min (ref >60.00)
Glucose, Bld: 94 mg/dL (ref 70–99)
Potassium: 4 mEq/L (ref 3.5–5.1)
Sodium: 136 mEq/L (ref 135–145)
Total Bilirubin: 0.9 mg/dL (ref 0.2–1.2)
Total Protein: 6.6 g/dL (ref 6.0–8.3)

## 2019-06-05 LAB — CBC
HCT: 40 % (ref 36.0–46.0)
Hemoglobin: 13.4 g/dL (ref 12.0–15.0)
MCHC: 33.4 g/dL (ref 30.0–36.0)
MCV: 88.1 fl (ref 78.0–100.0)
Platelets: 294 10*3/uL (ref 150.0–400.0)
RBC: 4.54 Mil/uL (ref 3.87–5.11)
RDW: 14.7 % (ref 11.5–15.5)
WBC: 6.3 10*3/uL (ref 4.0–10.5)

## 2019-06-05 LAB — LIPID PANEL
Cholesterol: 220 mg/dL — ABNORMAL HIGH (ref 0–200)
HDL: 29.6 mg/dL — ABNORMAL LOW (ref >39.00)
LDL Cholesterol: 164 mg/dL — ABNORMAL HIGH (ref 0–99)
NonHDL: 190.13
Total CHOL/HDL Ratio: 7
Triglycerides: 133 mg/dL (ref 0.0–149.0)
VLDL: 26.6 mg/dL (ref 0.0–40.0)

## 2019-06-05 LAB — HEMOGLOBIN A1C: Hgb A1c MFr Bld: 5.5 % (ref 4.6–6.5)

## 2019-06-05 MED ORDER — VALACYCLOVIR HCL 1 G PO TABS: ORAL_TABLET | ORAL | 0 refills | Status: DC

## 2019-06-05 MED ORDER — HYDROCHLOROTHIAZIDE 12.5 MG PO TABS: 12.5000 mg | ORAL_TABLET | Freq: Every day | ORAL | 3 refills | Status: DC

## 2019-06-05 NOTE — Assessment & Plan Note (Signed)
Infrequent use of Nexium, commended her on weight loss.

## 2019-06-05 NOTE — Assessment & Plan Note (Signed)
Overall controlled without treatment. She will be seeing an attention specialist soon.

## 2019-06-05 NOTE — Assessment & Plan Note (Signed)
Repeat CMP pending. Commended her on significant reduction in alcohol consumption.

## 2019-06-05 NOTE — Assessment & Plan Note (Addendum)
Infrequent oral sores, refill provided for Valtrex to use PRN.

## 2019-06-05 NOTE — Assessment & Plan Note (Signed)
Commended her on weight loss, encouraged to continue! °Repeat A1C pending °

## 2019-06-05 NOTE — Patient Instructions (Signed)
Stop by the lab prior to leaving today. I will notify you of your results once received.   Continue to work on a healthy diet, congratulations on your weight loss!! Ensure you are consuming 64 ounces of water daily.  It was a pleasure to see you today!

## 2019-06-05 NOTE — Assessment & Plan Note (Signed)
Commended her on weight loss, repeat lipid panel pending.

## 2019-06-05 NOTE — Assessment & Plan Note (Signed)
Significant reduction of alcohol intake, commended her on this!! Repeat LFT's pending.

## 2019-06-05 NOTE — Assessment & Plan Note (Signed)
Following with GI, repeat colonoscopy due in Fall 2021.

## 2019-06-05 NOTE — Assessment & Plan Note (Signed)
Significant reduction since last visit, commended her on this. Repeat LFT's pending.

## 2019-06-05 NOTE — Assessment & Plan Note (Signed)
Well controlled in the office today on HCTZ and losartan. Commended her on weight loss and alcohol reduction.   CMP pending

## 2019-06-05 NOTE — Progress Notes (Signed)
Subjective:    Patient ID: Colleen Boone, female    DOB: 12/07/1970, 49 y.o.   MRN: [id number]  HPI  This visit occurred during the SARS-CoV-2 public health emergency.  Safety protocols were in place, including screening questions prior to the visit, additional usage of staff PPE, and extensive cleaning of exam room while observing appropriate contact time as indicated for disinfecting solutions.   Colleen Boone is a 49 year old female with a history of adenocarcinoma of rectosigmoid junction (in adenomatous polyp), prediabetes, NAFLD, hypertension, GAD, hyperlipidemia who presents today for follow up.  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 She is needing a refill as her older Rx is expired. Infrequent outbreaks.   2) GERD: Currently managed on Nexium 20 mg, but using infrequently since she's lost weight.   3) Essential Hypertension: Currently managed on losartan 50 mg, HCTZ 12.5 mg.  She denies chest pain, dizziness, increased headaches. She is needing refills of HCTZ.  BP Readings from Last 3 Encounters:  06/05/19 118/82  01/11/19 130/84  11/02/18 98/60   4) Alcohol Abuse: History of fatty liver disease with elevated LFT's. Previously drinking nearly one bottle of wine daily, had been doing this for years. In February 2021 she started a new diet plan which discourages alcohol consumption. She is now drinking a shot of vodka in water every other day, no more wine.  Wt Readings from Last 3 Encounters:  06/05/19 201 lb 4 oz (91.3 kg)  01/11/19 204 lb 6 oz (92.7 kg)  11/06/18 216 lb (98 kg)   5) Family history of Colon Cancer: History of adenocarcinoma of rectosigmoid junction in Fall 2020. Due for repeat colonoscopy in Fall 2021. Following with GI.   Review of Systems  Constitutional: Negative for unexpected weight change.  HENT: Negative for rhinorrhea.   Respiratory: Negative for shortness of breath.   Cardiovascular: Negative for chest pain.    Gastrointestinal: Negative for constipation and diarrhea.  Genitourinary: Negative for difficulty urinating.  Musculoskeletal: Negative for arthralgias.  Skin: Negative for rash.  Allergic/Immunologic: Negative for environmental allergies.  Neurological: Negative for dizziness.  Psychiatric/Behavioral:       Difficulty focusing, will be seeing an attention specialist in mid May.        Past Medical History:  Diagnosis Date  . Abnormal Pap smear of cervix   . Accessory ureter   . 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 Anxiety    tapered off sertraline, stable.  . Family history of bladder cancer   . Family history of colon cancer   . Fibroids 12/06/2011   Ultrasound showed: 8.8 x 4.6 x 5.6 cm uterus. 2.0 cm intramural fibroid in the uterine fundus, second intramural 1.7 cm fibroid in the right anterior upper corpus. EM 5 mm, no focal lesion. Normal ovaries.   Marland Kitchen GERD (gastroesophageal reflux disease)   . Hay fever   . Herpes labialis    acid foods and stress incite  . History of gestational diabetes   . HLD (hyperlipidemia)   . HTN (hypertension)   . Obesity   . Uterine polyp   . Vaginal cyst      Social History   Socioeconomic History  . Marital status: Single    Spouse name: Not on file  . Number of children: Not on file  . Years of education: Not on file  . Highest education level: Not on file  Occupational History  . Not on file  Tobacco  Use  . Smoking status: Former Smoker  . Smokeless tobacco: Never Used  . Tobacco comment: was social smoker only  Substance and Sexual Activity  . Alcohol use: Yes    Comment: 2 glasses red wine nightly  . Drug use: No  . Sexual activity: Not Currently    Partners: Male    Birth control/protection: Condom, Injection    Comment: depo povera  Other Topics Concern  . Not on file  Social History Narrative   Caffeine: 1 cup coffee/day   Lives with son (1998), 2 dogs   Occupation: Therapist, sports at [employer], working on  [employer]   Activity: gym 2-3x/wk, cardio, back in school   Diet: not lots of fruits.  Some water.   Social Determinants of Health   Financial Resource Strain:   . Difficulty of Paying Living Expenses:   Food Insecurity:   . Worried About Charity fundraiser in the Last Year:   . Arboriculturist in the Last Year:   Transportation Needs:   . Film/video editor (Medical):   Marland Kitchen Lack of Transportation (Non-Medical):   Physical Activity:   . Days of Exercise per Week:   . Minutes of Exercise per Session:   Stress:   . Feeling of Stress :   Social Connections:   . Frequency of Communication with Friends and Family:   . Frequency of Social Gatherings with Friends and Family:   . Attends Religious Services:   . Active Member of Clubs or Organizations:   . Attends Archivist Meetings:   Marland Kitchen Marital Status:   Intimate Partner Violence:   . Fear of Current or Ex-Partner:   . Emotionally Abused:   Marland Kitchen Physically Abused:   . Sexually Abused:     Past Surgical History:  Procedure Laterality Date  . DILATION AND CURETTAGE OF UTERUS  2007  . TONSILLECTOMY  1975  . TYMPANOSTOMY    . WISDOM TOOTH EXTRACTION      x4    Family History  Problem Relation Age of Onset  . Hypertension Mother   . Diabetes Mother   . Other Mother        NASH with varices  . Liver disease Mother        NASH, ITP  . Colon polyps Mother   . Coronary artery disease Father 48       massive MI  . Cancer Father 23       lymphoma  . Hypertension Father   . Hyperlipidemia Father   . Diabetes Father   . Diabetes Brother   . Deep vein thrombosis Brother   . Colon cancer Maternal Uncle 84  . Stroke Maternal Grandmother        post card cath  . Cancer Maternal Grandmother        leukemia and polycythemia  . Hypertension Maternal Grandmother   . Hypertension Maternal Grandfather   . Heart attack Maternal Grandfather   . Stroke Paternal Grandmother   . Hypertension Paternal Grandmother   .  Stroke Paternal Grandfather   . Hypertension Paternal Grandfather   . Heart attack Paternal Grandfather   . Bladder Cancer Maternal Uncle   . Other Sister        MVA  . Breast cancer Neg Hx   . Esophageal cancer Neg Hx   . Stomach cancer Neg Hx   . Rectal cancer Neg Hx     Allergies  Allergen Reactions  . Latex Rash  .  Aleve [Naproxen Sodium]     Hot flashes  . Zocor [Simvastatin] Other (See Comments)    fatigue    Current Outpatient Medications on File Prior to Visit  Medication Sig Dispense Refill  . Acetaminophen (TYLENOL) 325 MG CAPS     . esomeprazole (NEXIUM) 20 MG capsule Take 20 mg by mouth daily at 12 noon.    . Ibuprofen 200 MG CAPS     . levonorgestrel (MIRENA) 20 MCG/24HR IUD 1 each by Intrauterine route once. Inserted 03/2016    . losartan (COZAAR) 50 MG tablet Take 1 tablet (50 mg total) by mouth daily. For blood pressure 90 tablet 3   No current facility-administered medications on file prior to visit.    BP 118/82   Pulse 91   Temp (!) 96.3 F (35.7 C) (Temporal)   Ht 5' 2.75" (1.594 m)   Wt 201 lb 4 oz (91.3 kg)   SpO2 97%   BMI 35.93 kg/m    Objective:   Physical Exam  Constitutional: She appears well-nourished.  Cardiovascular: Normal rate and regular rhythm.  Respiratory: Effort normal and breath sounds normal.  GI: Soft. Bowel sounds are normal. There is no abdominal tenderness.  Neurological:  Reflex Scores:      Patellar reflexes are 2+ on the right side and 2+ on the left side. Skin: Skin is warm and dry.  Psychiatric: She has a normal mood and affect.           Assessment & Plan:

## 2019-06-13 MED FILL — HYDROCHLOROTHIAZIDE 12.5 MG: 12.5 | 90 days supply | Qty: 90 | Fill #0

## 2019-06-13 MED FILL — valACYclovir HCL 1 GM TABS: 1 | 3 days supply | Qty: 12 | Fill #0

## 2019-06-17 ENCOUNTER — Encounter: Payer: Self-pay | Admitting: Genetic Counselor

## 2019-06-17 ENCOUNTER — Telehealth: Payer: Self-pay | Admitting: Genetic Counselor

## 2019-06-17 ENCOUNTER — Ambulatory Visit: Payer: Self-pay | Admitting: Genetic Counselor

## 2019-06-17 DIAGNOSIS — Z1379 Encounter for other screening for genetic and chromosomal anomalies: Secondary | ICD-10-CM

## 2019-06-17 NOTE — Progress Notes (Signed)
HPI:  Colleen Boone was previously seen in the [place] clinic due to a personal and family history of cancer and concerns regarding a hereditary predisposition to cancer. Please refer to our prior cancer genetics clinic note for more information regarding our discussion, assessment and recommendations, at the time. Colleen Boone recent genetic test results were disclosed to her, as were recommendations warranted by these results. These results and recommendations are discussed in more detail below.  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  The CancerNext-Expanded gene panel offered by [facility] and includes sequencing and rearrangement analysis for the following 77 genes: AIP, ALK, APC*, ATM*, AXIN2, BAP1, BARD1, BLM, BMPR1A, BRCA1*, BRCA2*, BRIP1*, CDC73, CDH1*, CDK4, CDKN1B, CDKN2A, CHEK2*, CTNNA1, DICER1, FANCC, FH, FLCN, GALNT12, KIF1B, LZTR1, MAX, MEN1, MET, MLH1*, MSH2*, MSH3, MSH6*, MUTYH*, NBN, NF1*, NF2, NTHL1, PALB2*, PHOX2B, PMS2*, POT1, PRKAR1A, PTCH1, PTEN*, RAD51C*, RAD51D*, RB1, RECQL, RET, SDHA, SDHAF2, SDHB, SDHC, SDHD, SMAD4, SMARCA4, SMARCB1, SMARCE1, STK11, SUFU, TMEM127, TP53*, TSC1, TSC2, VHL and XRCC2 (sequencing and deletion/duplication); EGFR, EGLN1, HOXB13, KIT, MITF, PDGFRA, POLD1, and POLE (sequencing only); EPCAM and GREM1 (deletion/duplication only). DNA and RNA analyses performed for * genes. The report date is 06/16/2019.     FAMILY HISTORY:  We obtained a detailed, 4-generation family history.  Significant diagnoses are listed below: Family History  Problem Relation Age of Onset  . Hypertension Mother   . Diabetes Mother   . Other Mother        NASH with varices  . Liver disease  Mother        NASH, ITP  . Colon polyps Mother   . Coronary artery disease Father 71       massive MI  . Cancer Father 60       lymphoma  . Hypertension Father   . Hyperlipidemia Father   . Diabetes Father   . Diabetes Brother   . Deep vein thrombosis Brother   . Colon cancer Maternal Uncle 26  . Stroke Maternal Grandmother        post card cath  . Cancer Maternal Grandmother        leukemia and polycythemia  . Hypertension Maternal Grandmother   . Hypertension Maternal Grandfather   . Heart attack Maternal Grandfather   . Stroke Paternal Grandmother   . Hypertension Paternal Grandmother   . Stroke Paternal Grandfather   . Hypertension Paternal Grandfather   . Heart attack Paternal Grandfather   . Bladder Cancer Maternal Uncle   . Other Sister        MVA  . Breast cancer Neg Hx   . Esophageal cancer Neg Hx   . Stomach cancer Neg Hx   . Rectal cancer Neg Hx     The patient has one son who is cancer free.  She has a brother and sister, her sister died at 42 from a car accident.  Her brother does not have cancer.  Both parents are deceased.  The patient's father had lymphoma at 48.  He has three sisters and two brothers, all whom are cancer free.  There is no reported family history of cancer on the paternal side of the family.  The patient's mother died from complications of NASH. She had two brothers and a sister.  One  brother had colon cancer and one brother had bladder cancer.  The sister had a son who had an unknown cancer. The grandparents are deceased, the grandmother had leukemia.  Colleen Boone is unaware of previous family history of genetic testing for hereditary cancer risks. Patient's maternal ancestors are of Caucasian descent, and paternal ancestors are of Caucasian descent. There is no reported Ashkenazi Jewish ancestry. There is no known consanguinity.    GENETIC TEST RESULTS: Genetic testing reported out on June 16, 2019 through the  CancerNext-Expanded+RNAinsight cancer panel found no pathogenic mutations. The CancerNext-Expanded gene panel offered by [facility] and includes sequencing and rearrangement analysis for the following 77 genes: AIP, ALK, APC*, ATM*, AXIN2, BAP1, BARD1, BLM, BMPR1A, BRCA1*, BRCA2*, BRIP1*, CDC73, CDH1*, CDK4, CDKN1B, CDKN2A, CHEK2*, CTNNA1, DICER1, FANCC, FH, FLCN, GALNT12, KIF1B, LZTR1, MAX, MEN1, MET, MLH1*, MSH2*, MSH3, MSH6*, MUTYH*, NBN, NF1*, NF2, NTHL1, PALB2*, PHOX2B, PMS2*, POT1, PRKAR1A, PTCH1, PTEN*, RAD51C*, RAD51D*, RB1, RECQL, RET, SDHA, SDHAF2, SDHB, SDHC, SDHD, SMAD4, SMARCA4, SMARCB1, SMARCE1, STK11, SUFU, TMEM127, TP53*, TSC1, TSC2, VHL and XRCC2 (sequencing and deletion/duplication); EGFR, EGLN1, HOXB13, KIT, MITF, PDGFRA, POLD1, and POLE (sequencing only); EPCAM and GREM1 (deletion/duplication only). DNA and RNA analyses performed for * genes. The test report has been scanned into EPIC and is located under the Molecular Pathology section of the Results Review tab.  A portion of the result report is included below for reference.     We discussed with Colleen Boone that because current genetic testing is not perfect, it is possible there may be a gene mutation in one of these genes that current testing cannot detect, but that chance is small.  We also discussed, that there could be another gene that has not yet been discovered, or that we have not yet tested, that is responsible for the cancer diagnoses in the family. It is also possible there is a hereditary cause for the cancer in the family that Colleen Boone did not inherit and therefore was not identified in her testing.  Therefore, it is important to remain in touch with cancer genetics in the future so that we can continue to offer Colleen Boone the most up to date genetic testing.   ADDITIONAL GENETIC TESTING: We discussed with Colleen Boone that her genetic testing was fairly extensive.  If there are genes identified to increase cancer  risk that can be analyzed in the future, we would be happy to discuss and coordinate this testing at that time.    CANCER SCREENING RECOMMENDATIONS: Colleen Boone test result is considered negative (normal).  This means that we have not identified a hereditary cause for her personal and family history of cancer at this time. Most cancers happen by chance and this negative test suggests that her cancer may fall into this category.    While reassuring, this does not definitively rule out a hereditary predisposition to cancer. It is still possible that there could be genetic mutations that are undetectable by current technology. There could be genetic mutations in genes that have not been tested or identified to increase cancer risk.  Therefore, it is recommended she continue to follow the cancer management and screening guidelines provided by her oncology and primary healthcare provider.   An individual's cancer risk and medical management are not determined by genetic test results alone. Overall cancer risk assessment incorporates additional factors, including personal medical history, family history, and any available genetic information that may result in a personalized plan for cancer prevention and surveillance  This  negative genetic test simply tells [place] that we cannot yet define why Colleen Boone has had colorectal cancer at a young age. Colleen Boone medical management and screening should be based on the prospect that she  may be at an increased risk for a second colorectal cancer in the future and should, therefore, undergo more frequent colonoscopy screening at intervals determined by her GI providers.  We also recommended that Colleen Boone have an upper endoscopy periodically.  RECOMMENDATIONS FOR FAMILY MEMBERS:  Individuals in this family might be at some increased risk of developing cancer, over the general population risk, simply due to the family history of cancer.  We recommended women in this  family have a yearly mammogram beginning at age 26, or 74 years younger than the earliest onset of cancer, an annual clinical breast exam, and perform monthly breast self-exams. Women in this family should also have a gynecological exam as recommended by their primary provider. All family members should have a colonoscopy by age 13.  FOLLOW-UP: Lastly, we discussed with Colleen Boone that cancer genetics is a rapidly advancing field and it is possible that new genetic tests will be appropriate for her and/or her family members in the future. We encouraged her to remain in contact with cancer genetics on an annual basis so we can update her personal and family histories and let her know of advances in cancer genetics that may benefit this family.   Our contact number was provided. Colleen Boone questions were answered to her satisfaction, and she knows she is welcome to call us at anytime with additional questions or concerns.   Roma Kayser, [place], [facility] Licensed, Certified Genetic Counselor [email or web address]

## 2019-06-17 NOTE — Telephone Encounter (Signed)
Revealed negative genetic testing.  Discussed that we do not know why she has colon cancer or why there is cancer in the family. It could be due to a different gene that we are not testing, or maybe our current technology may not be able to pick something up.  It will be important for her to keep in contact with genetics to keep up with whether additional testing may be needed.

## 2019-06-24 ENCOUNTER — Ambulatory Visit: Payer: 59 | Admitting: Dermatology

## 2019-06-27 ENCOUNTER — Other Ambulatory Visit: Payer: Self-pay | Admitting: Primary Care

## 2019-06-27 DIAGNOSIS — I10 Essential (primary) hypertension: Secondary | ICD-10-CM

## 2019-06-27 MED FILL — LOSARTAN POTASSIUM 50 MG TA: 50 | 90 days supply | Qty: 90 | Fill #0

## 2019-07-08 ENCOUNTER — Telehealth: Payer: Self-pay

## 2019-07-08 NOTE — Telephone Encounter (Signed)
-----   Message from Jenaya Saar E [place], [place] sent at 01/11/2019 12:53 PM EST ----- Set up a patient's Hepatitis A #2 for 07/12/2019 , first one done 01/11/2019.

## 2019-07-08 NOTE — Telephone Encounter (Signed)
Left message for her to call and set up her #2 Hep. A for May 7th or later.

## 2019-07-10 NOTE — Telephone Encounter (Signed)
Left another message for her to call me and set up her vaccine.

## 2019-07-15 NOTE — Telephone Encounter (Signed)
Left another detailed message on her voice mail to call me to set up the appointment.

## 2019-07-18 NOTE — Telephone Encounter (Signed)
I am mailing her a letter for her to call us to set up her #2 Hep. A appointment since I have been unable to reach her by phone.

## 2019-07-19 ENCOUNTER — Ambulatory Visit (INDEPENDENT_AMBULATORY_CARE_PROVIDER_SITE_OTHER): Payer: 59 | Admitting: Internal Medicine

## 2019-07-19 DIAGNOSIS — Z23 Encounter for immunization: Secondary | ICD-10-CM

## 2019-07-19 DIAGNOSIS — K760 Fatty (change of) liver, not elsewhere classified: Secondary | ICD-10-CM

## 2019-07-23 DIAGNOSIS — F419 Anxiety disorder, unspecified: Secondary | ICD-10-CM | POA: Diagnosis not present

## 2019-07-23 DIAGNOSIS — F338 Other recurrent depressive disorders: Secondary | ICD-10-CM | POA: Diagnosis not present

## 2019-07-23 DIAGNOSIS — R41840 Attention and concentration deficit: Secondary | ICD-10-CM | POA: Diagnosis not present

## 2019-07-23 DIAGNOSIS — F4010 Social phobia, unspecified: Secondary | ICD-10-CM | POA: Diagnosis not present

## 2019-07-23 DIAGNOSIS — Z79899 Other long term (current) drug therapy: Secondary | ICD-10-CM | POA: Diagnosis not present

## 2019-07-23 DIAGNOSIS — F902 Attention-deficit hyperactivity disorder, combined type: Secondary | ICD-10-CM | POA: Diagnosis not present

## 2019-07-23 DIAGNOSIS — F429 Obsessive-compulsive disorder, unspecified: Secondary | ICD-10-CM | POA: Diagnosis not present

## 2019-08-01 MED FILL — guanFACINE HCL ER 1 MG TB24: 1 | 30 days supply | Qty: 30 | Fill #0

## 2019-08-21 ENCOUNTER — Encounter: Payer: Self-pay | Admitting: Radiology

## 2019-09-09 MED FILL — HYDROCHLOROTHIAZIDE 12.5 MG: 12.5 | 90 days supply | Qty: 90 | Fill #1

## 2019-09-18 ENCOUNTER — Encounter: Payer: Self-pay | Admitting: Internal Medicine

## 2019-09-18 DIAGNOSIS — F900 Attention-deficit hyperactivity disorder, predominantly inattentive type: Secondary | ICD-10-CM | POA: Diagnosis not present

## 2019-09-18 DIAGNOSIS — F331 Major depressive disorder, recurrent, moderate: Secondary | ICD-10-CM | POA: Diagnosis not present

## 2019-09-18 DIAGNOSIS — F411 Generalized anxiety disorder: Secondary | ICD-10-CM | POA: Diagnosis not present

## 2019-09-18 MED FILL — AMPHETAMINE SALTS 10 MG: 10 | 30 days supply | Qty: 60 | Fill #0

## 2019-10-04 MED FILL — LOSARTAN POTASSIUM 50 MG TA: 50 | 90 days supply | Qty: 90 | Fill #1

## 2019-10-15 DIAGNOSIS — F411 Generalized anxiety disorder: Secondary | ICD-10-CM | POA: Diagnosis not present

## 2019-10-15 DIAGNOSIS — F331 Major depressive disorder, recurrent, moderate: Secondary | ICD-10-CM | POA: Diagnosis not present

## 2019-10-15 DIAGNOSIS — F900 Attention-deficit hyperactivity disorder, predominantly inattentive type: Secondary | ICD-10-CM | POA: Diagnosis not present

## 2019-10-16 ENCOUNTER — Other Ambulatory Visit: Payer: Self-pay | Admitting: Primary Care

## 2019-10-16 DIAGNOSIS — F411 Generalized anxiety disorder: Secondary | ICD-10-CM

## 2019-10-16 MED FILL — ADDERALL XR 15 MG CAP SA: 15 | 30 days supply | Qty: 30 | Fill #0

## 2019-10-17 MED FILL — BUPROPION HCL 75 MG TABS: 75 | 30 days supply | Qty: 60 | Fill #0

## 2019-10-21 ENCOUNTER — Ambulatory Visit: Payer: 59 | Admitting: Dermatology

## 2019-10-22 ENCOUNTER — Ambulatory Visit: Payer: 59 | Admitting: Dermatology

## 2019-10-23 ENCOUNTER — Other Ambulatory Visit: Payer: Self-pay

## 2019-10-23 ENCOUNTER — Encounter: Payer: Self-pay | Admitting: Advanced Practice Midwife

## 2019-10-23 ENCOUNTER — Ambulatory Visit (INDEPENDENT_AMBULATORY_CARE_PROVIDER_SITE_OTHER): Payer: 59 | Admitting: Advanced Practice Midwife

## 2019-10-23 ENCOUNTER — Other Ambulatory Visit (HOSPITAL_COMMUNITY)
Admission: RE | Admit: 2019-10-23 | Discharge: 2019-10-23 | Disposition: A | Discharge status: Home / Self Care | Payer: 59 | Source: Ambulatory Visit | Attending: Advanced Practice Midwife | Admitting: Advanced Practice Midwife

## 2019-10-23 VITALS — BP 121/83 | HR 79 | Wt 197.2 lb

## 2019-10-23 DIAGNOSIS — Z01419 Encounter for gynecological examination (general) (routine) without abnormal findings: Secondary | ICD-10-CM

## 2019-10-23 DIAGNOSIS — Z9889 Other specified postprocedural states: Secondary | ICD-10-CM

## 2019-10-23 DIAGNOSIS — R87810 Cervical high risk human papillomavirus (HPV) DNA test positive: Secondary | ICD-10-CM | POA: Diagnosis not present

## 2019-10-23 DIAGNOSIS — R87610 Atypical squamous cells of undetermined significance on cytologic smear of cervix (ASC-US): Secondary | ICD-10-CM | POA: Diagnosis not present

## 2019-10-23 NOTE — Progress Notes (Signed)
GYNECOLOGY ANNUAL PREVENTATIVE CARE ENCOUNTER NOTE  History:     Colleen Boone is a 49 y.o. G61P1011 female here for a routine annual gynecologic exam.  Current complaints: None.  New partner last summer, requests STI screening. Denies abnormal vaginal bleeding, discharge, pelvic pain, problems with intercourse or other gynecologic concerns.    Patient recently acknowledged concerns about Depression and is feeling confident about the interventions she is pursuing with her PCP.    Gynecologic History Patient's last menstrual period was 10/06/2019. Contraception: Mirena (2018) Last Pap: 10/2018. Results were: normal with negative HPV Last mammogram: 11/2018. Results were: normal  Obstetric History OB History  Gravida Para Term Preterm AB Living  2 1 1  0 1 1  SAB TAB Ectopic Multiple Live Births  0 1 0 0 1    # Outcome Date GA Lbr Len/2nd Weight Sex Delivery Anes PTL Lv  2 TAB      TAB     1 Term      Vag-Spont   LIV    Past Medical History:  Diagnosis Date  . Abnormal Pap smear of cervix   . Accessory ureter   . 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 Anxiety    tapered off sertraline, stable.  . Family history of bladder cancer   . Family history of colon cancer   . Fibroids 12/06/2011   Ultrasound showed: 8.8 x 4.6 x 5.6 cm uterus. 2.0 cm intramural fibroid in the uterine fundus, second intramural 1.7 cm fibroid in the right anterior upper corpus. EM 5 mm, no focal lesion. Normal ovaries.   Marland Kitchen GERD (gastroesophageal reflux disease)   . Hay fever   . Herpes labialis    acid foods and stress incite  . History of gestational diabetes   . HLD (hyperlipidemia)   . HTN (hypertension)   . Obesity   . Uterine polyp   . Vaginal cyst     Past Surgical History:  Procedure Laterality Date  . DILATION AND CURETTAGE OF UTERUS  2007  . TONSILLECTOMY  1975  . TYMPANOSTOMY    . WISDOM TOOTH EXTRACTION      x4    Current Outpatient Medications on File  Prior to Visit  Medication Sig Dispense Refill  . esomeprazole (NEXIUM) 20 MG capsule Take 20 mg by mouth daily at 12 noon.    . hydrochlorothiazide (HYDRODIURIL) 12.5 MG tablet Take 1 tablet (12.5 mg total) by mouth daily. For blood pressure. 90 tablet 3  . levonorgestrel (MIRENA) 20 MCG/24HR IUD 1 each by Intrauterine route once. Inserted 03/2016    . losartan (COZAAR) 50 MG tablet TAKE 1 TABLET BY MOUTH DAILY FOR BLOOD PRESSURE 90 tablet 3  . valACYclovir (VALTREX) 1000 MG tablet Take 2 tablets twice daily for one day at first sign of outbreak. 12 tablet 0  . Acetaminophen (TYLENOL) 325 MG CAPS  (Patient not taking: Reported on 10/23/2019)    . buPROPion (WELLBUTRIN) 75 MG tablet Take 75 mg by mouth 2 (two) times daily.    . Ibuprofen 200 MG CAPS  (Patient not taking: Reported on 10/23/2019)     No current facility-administered medications on file prior to visit.    Allergies  Allergen Reactions  . Latex Rash  . Aleve [Naproxen Sodium]     Hot flashes  . Zocor [Simvastatin] Other (See Comments)    fatigue    Social History:  reports that she has quit smoking. She has never used smokeless tobacco. She  reports current alcohol use. She reports that she does not use drugs.  Family History  Problem Relation Age of Onset  . Hypertension Mother   . Diabetes Mother   . Other Mother        NASH with varices  . Liver disease Mother        NASH, ITP  . Colon polyps Mother   . Coronary artery disease Father 61       massive MI  . Cancer Father 45       lymphoma  . Hypertension Father   . Hyperlipidemia Father   . Diabetes Father   . Diabetes Brother   . Deep vein thrombosis Brother   . Colon cancer Maternal Uncle 28  . Stroke Maternal Grandmother        post card cath  . Cancer Maternal Grandmother        leukemia and polycythemia  . Hypertension Maternal Grandmother   . Hypertension Maternal Grandfather   . Heart attack Maternal Grandfather   . Stroke Paternal Grandmother     . Hypertension Paternal Grandmother   . Stroke Paternal Grandfather   . Hypertension Paternal Grandfather   . Heart attack Paternal Grandfather   . Bladder Cancer Maternal Uncle   . Other Sister        MVA  . Breast cancer Neg Hx   . Esophageal cancer Neg Hx   . Stomach cancer Neg Hx   . Rectal cancer Neg Hx     The following portions of the patient's history were reviewed and updated as appropriate: allergies, current medications, past family history, past medical history, past social history, past surgical history and problem list.  Review of Systems Pertinent items noted in HPI and remainder of comprehensive ROS otherwise negative.  Physical Exam:  BP 121/83   Pulse 79   Wt 197 lb 3.2 oz (89.4 kg)   LMP 10/06/2019   BMI 35.21 kg/m  CONSTITUTIONAL: Well-developed, well-nourished female in no acute distress.  HENT:  Normocephalic, atraumatic,Oropharynx is clear and moist EYES: Conjunctivae and EOM are normal. Pupils are equal, round, and reactive to light. No scleral icterus.  NECK: Normal range of motion, supple. SKIN: Skin is warm and dry. No rash noted. Not diaphoretic. No erythema. No pallor. MUSCULOSKELETAL: Normal range of motion. No tenderness.  No cyanosis, clubbing, or edema.  2+ distal pulses. NEUROLOGIC: Alert and oriented to person, place, and time. Normal reflexes, muscle tone coordination.  PSYCHIATRIC: Normal mood and affect. Normal behavior. Normal judgment and thought content. CARDIOVASCULAR: Normal heart rate noted, regular rhythm RESPIRATORY: Clear to auscultation bilaterally. Effort and breath sounds normal, no problems with respiration noted. BREASTS: Symmetric in size. No masses, tenderness, skin changes, nipple drainage, or lymphadenopathy bilaterally. Performed in the presence of a chaperone. ABDOMEN: Soft, no distention noted.  No tenderness, rebound or guarding.  PELVIC: Normal appearing external genitalia and urethral meatus; normal appearing vaginal  mucosa and cervix.  No abnormal discharge noted.  Pap smear obtained.  Normal uterine size, no other palpable masses, no uterine or adnexal tenderness. IUD strings visualized at appropriate length  Performed in the presence of a chaperone.   Assessment and Plan:    1. Well woman exam with routine gynecological exam - No concerning findings on physical exam - Cytology - PAP( [place]) - Cervicovaginal ancillary only( [place]) - HIV Antibody (routine testing w rflx) - Hep C Antibody - RPR - MM 3D SCREEN BREAST BILATERAL; Future  2. ASCUS with positive  high risk HPV cervical - in 2014 followed by [place] 09/2012 - NIL 10/2018, 03/2016, 12/2014  Will follow up results of pap smear and manage accordingly. Mammogram scheduled Routine preventative health maintenance measures emphasized. Please refer to After Visit Summary for other counseling recommendations.   Total visit time: 30 minutes. Greater than 50% of visit spent in counseling and coordination of care  Mallie Snooks, MSN, CNM Certified Nurse Midwife, [facility] for [employer], [place] 10/23/19 10:17 AM

## 2019-10-23 NOTE — Progress Notes (Signed)
Last pap -normal  Mammo-11/27/2018

## 2019-10-23 NOTE — Patient Instructions (Signed)

## 2019-10-24 LAB — HIV ANTIBODY (ROUTINE TESTING W REFLEX): HIV Screen 4th Generation wRfx: NONREACTIVE

## 2019-10-24 LAB — CERVICOVAGINAL ANCILLARY ONLY
Bacterial Vaginitis (gardnerella): NEGATIVE
Candida Glabrata: NEGATIVE
Candida Vaginitis: POSITIVE — AB
Chlamydia: NEGATIVE
Comment: NEGATIVE
Comment: NEGATIVE
Comment: NEGATIVE
Comment: NEGATIVE
Comment: NEGATIVE
Comment: NORMAL
Neisseria Gonorrhea: NEGATIVE
Trichomonas: NEGATIVE

## 2019-10-24 LAB — CYTOLOGY - PAP
Comment: NEGATIVE
Diagnosis: NEGATIVE
High risk HPV: NEGATIVE

## 2019-10-24 LAB — RPR: RPR Ser Ql: NONREACTIVE

## 2019-10-24 LAB — HEPATITIS C ANTIBODY: Hep C Virus Ab: 0.1 {s_co_ratio} (ref 0.0–0.9)

## 2019-10-28 ENCOUNTER — Ambulatory Visit (INDEPENDENT_AMBULATORY_CARE_PROVIDER_SITE_OTHER): Payer: 59 | Admitting: Dermatology

## 2019-10-28 ENCOUNTER — Other Ambulatory Visit: Payer: Self-pay

## 2019-10-28 ENCOUNTER — Telehealth: Payer: Self-pay | Admitting: *Deleted

## 2019-10-28 DIAGNOSIS — L820 Inflamed seborrheic keratosis: Secondary | ICD-10-CM

## 2019-10-28 DIAGNOSIS — I781 Nevus, non-neoplastic: Secondary | ICD-10-CM

## 2019-10-28 DIAGNOSIS — D1801 Hemangioma of skin and subcutaneous tissue: Secondary | ICD-10-CM

## 2019-10-28 MED ORDER — FLUCONAZOLE 150 MG PO TABS: 150.0000 mg | ORAL_TABLET | Freq: Once | ORAL | 1 refills | Status: AC

## 2019-10-28 MED FILL — FLUCONAZOLE 150 MG TABS: 150 | 1 days supply | Qty: 1 | Fill #0

## 2019-10-28 NOTE — Telephone Encounter (Signed)
Pt informed of normal pap results, but has yeast infection, offered OTC monistat or Diflucan. Pt desires Diflucan. Will send that to pharmacy for her.

## 2019-10-28 NOTE — Patient Instructions (Signed)
BEFORE YOUR APPOINTMENT FOR SCLEROTHERAPY  1. When you telephone for your appointment for the sclerotherapy procedure, please let the receptionist know that you are scheduling for the fifteen (15) minute sclerotherapy procedure not just a regular visit.  2. On the day of the procedure, please cleanse and dry the areas, but do not use any moisturizers or other products on the area(s) to be treated.  3. Bring a pair of comfortable shorts to wear during the procedure.  4. Be sure to bring your recommended graduated compression stockings with you to the office. You will be wearing them home when your visit is over. These compression hose can be purchased at most medical supply stores.  After Your Sclerotherapy Procedure  1. Please wear the graduated compression stockings for 24 hours immediately following the completion of the sclerotherapy procedure.  2. We recommend that you avoid vigorous activity as much as possible for the first twenty-four (24) hours. You can do your "normal" routine, but avoid an above normal amount of time on your feet. Elevating the legs when sitting and avoidance of vigorous leg movements or exercise in the first few days after treatment may improve your results.  3. You may remove the compression dressings (cotton balls) and tape the next morning.  4. Please continue wearing the compression stockings during waking hours for the two (2) weeks following sclerotherapy.  5. If you have any blisters, sores or ulcers or other problems following your procedure please call or return to the office immediately.     THE PROCEDURE FEE IS $350.00 PER FIFTEEN (15) MINUTE SESSION. WE REQUIRE THAT THIS PROCEDURE BE PAID FOR IN FULL ON OR BEFORE THE DATE THAT IT IS PERFORMED. WE WILL GIVE YOU A RECEIPT THAT YOU CAN FILE WITH YOUR INSURANCE COMPANY. WE GENERALLY DO NOT FILE THIS PROCEDURE WITH ANY INSURANCE COMPANY EXCEPT UNDER CERTAIN CIRCUMSTANCES WHERE PRIOR AUTHORIZATION HAS BEEN  CONFIRMED. THIS PROCEDURE IS GENERALLY CONSIDERED TO BE A COSMETIC PROCEDURE BY INSURANCE COMPANIES. 

## 2019-10-28 NOTE — Telephone Encounter (Signed)
-----   Message from Darlina Rumpf, [place] sent at 10/28/2019  8:10 AM EDT ----- Patient offered OTC Monistat vs Diflucan and will let us know if she wants rx. Thanks! SW

## 2019-10-28 NOTE — Progress Notes (Signed)
   Follow-Up Visit   Subjective  Colleen Boone is a 49 y.o. female who presents for the following: spider veins (bil legs, pt presents for sclerotherapy) and Skin Tag (neck, chest, irritating). Also spot on thigh gets irritated and bleeds.   The following portions of the chart were reviewed this encounter and updated as appropriate:      Review of Systems:  No other skin or systemic complaints except as noted in HPI or Assessment and Plan.  Objective  Well appearing patient in no apparent distress; mood and affect are within normal limits.  A focused examination was performed including chest, bil legs. Relevant physical exam findings are noted in the Assessment and Plan.  Objective  L sternum x 1: Waxy flesh pap  Objective  bil legs: Telangiectasias L leg, R leg  Images              Objective  Right medial thigh: Red pap   Assessment & Plan  Inflamed seborrheic keratosis L sternum x 1  Destruction of lesion - L sternum x 1  Destruction method: cryotherapy   Informed consent: discussed and consent obtained   Lesion destroyed using liquid nitrogen: Yes   Region frozen until ice ball extended beyond lesion: Yes   Outcome: patient tolerated procedure well with no complications   Post-procedure details: wound care instructions given    Spider veins bil legs  Intralesional injection - bil legs The patient presents for desired sclerotherapy for desired treatment of desired treatment of small to 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 R popliteal, R medial ankle, L medial ankle.  Procedure: The patient was counseled and understands about the effects, side effects and potential risks and complications of the sclerotherapy procedure. The patient was given the opportunity to ask questions. Asclera (polidocanol) 1% (total 3.5cc) was injected into the varices. In order to ensure correct  placement of the catheter in the vein, I drew back slightly to give moderate blood show in the syringe. If there was any evidence or suspicion of extravasation of sclerosant, the area was immediately diluted with a large volume of 0.9% saline. A pressure dressing was applied immediately to the injected sites. The patient tolerated the procedure well without complication. The patient was instructed in post-operative compression stocking use. The patient understands to call or return immediately if any problems noted.   Hemangioma of skin Right medial thigh  Plan shave removal on f/u  Return in about 6 weeks (around 12/09/2019) for post-op sclero, shave removal hemangioma.  I, Othelia Pulling, RMA, am acting as scribe for Brendolyn Patty, MD . Documentation: I have reviewed the above documentation for accuracy and completeness, and I agree with the above.  Brendolyn Patty MD

## 2019-10-29 ENCOUNTER — Other Ambulatory Visit: Payer: Self-pay | Admitting: Advanced Practice Midwife

## 2019-10-29 DIAGNOSIS — Z1231 Encounter for screening mammogram for malignant neoplasm of breast: Secondary | ICD-10-CM

## 2019-11-06 DIAGNOSIS — Z20828 Contact with and (suspected) exposure to other viral communicable diseases: Secondary | ICD-10-CM | POA: Diagnosis not present

## 2019-11-18 ENCOUNTER — Other Ambulatory Visit (HOSPITAL_BASED_OUTPATIENT_CLINIC_OR_DEPARTMENT_OTHER): Payer: Self-pay

## 2019-11-18 DIAGNOSIS — F331 Major depressive disorder, recurrent, moderate: Secondary | ICD-10-CM | POA: Diagnosis not present

## 2019-11-18 DIAGNOSIS — F411 Generalized anxiety disorder: Secondary | ICD-10-CM | POA: Diagnosis not present

## 2019-11-18 DIAGNOSIS — F900 Attention-deficit hyperactivity disorder, predominantly inattentive type: Secondary | ICD-10-CM | POA: Diagnosis not present

## 2019-11-19 MED FILL — busPIRone HCL 5 MG TABS: 5 | 30 days supply | Qty: 60 | Fill #0

## 2019-11-19 MED FILL — ADDERALL XR 15 MG CAP SA: 15 | 30 days supply | Qty: 30 | Fill #0

## 2019-11-22 ENCOUNTER — Encounter: Payer: 59 | Admitting: Internal Medicine

## 2019-11-26 ENCOUNTER — Ambulatory Visit: Payer: 59 | Attending: Internal Medicine

## 2019-11-26 DIAGNOSIS — Z23 Encounter for immunization: Secondary | ICD-10-CM

## 2019-11-26 MED FILL — FLUARIX QUADRIVALENT 0.5 ML: 0.5 | 1 days supply | Qty: 1 | Fill #0

## 2019-11-26 MED FILL — PFIZER-BIONTECH COVID-19 VA: 30 | 1 days supply | Qty: 0 | Fill #0

## 2019-11-26 NOTE — Progress Notes (Signed)
   Covid-19 Vaccination Clinic  Name:  Colleen Boone    MRN: [id number] DOB: [birth year]  11/26/2019  [name] was observed post Covid-19 immunization for 15 minutes without incident. She was provided with Vaccine Information Sheet and instruction to access the V-Safe system.  Vaccinated by [facility]  [name] was instructed to call 911 with any severe reactions post vaccine: Marland Kitchen Difficulty breathing  . Swelling of face and throat  . A fast heartbeat  . A bad rash all over body  . Dizziness and weakness

## 2019-11-29 MED FILL — HYDROCHLOROTHIAZIDE 12.5 MG: 12.5 | 90 days supply | Qty: 90 | Fill #0

## 2019-12-18 ENCOUNTER — Other Ambulatory Visit (HOSPITAL_BASED_OUTPATIENT_CLINIC_OR_DEPARTMENT_OTHER): Payer: Self-pay

## 2019-12-18 DIAGNOSIS — F411 Generalized anxiety disorder: Secondary | ICD-10-CM | POA: Diagnosis not present

## 2019-12-18 DIAGNOSIS — F900 Attention-deficit hyperactivity disorder, predominantly inattentive type: Secondary | ICD-10-CM | POA: Diagnosis not present

## 2019-12-18 DIAGNOSIS — F331 Major depressive disorder, recurrent, moderate: Secondary | ICD-10-CM | POA: Diagnosis not present

## 2019-12-19 MED FILL — ADDERALL XR 15 MG CAP SA: 15 | 30 days supply | Qty: 30 | Fill #0

## 2019-12-23 ENCOUNTER — Ambulatory Visit: Payer: 59 | Admitting: Dermatology

## 2019-12-24 MED FILL — busPIRone HCL 5 MG TABS: 5 | 30 days supply | Qty: 60 | Fill #0

## 2020-01-07 MED FILL — LOSARTAN POTASSIUM 50 MG TA: 50 | 90 days supply | Qty: 90 | Fill #0

## 2020-02-04 ENCOUNTER — Other Ambulatory Visit (HOSPITAL_BASED_OUTPATIENT_CLINIC_OR_DEPARTMENT_OTHER): Payer: Self-pay

## 2020-02-04 MED FILL — busPIRone HCL 5 MG TABS: 5 | 30 days supply | Qty: 90 | Fill #0

## 2020-02-04 MED FILL — ADDERALL XR 15 MG CAP SA: 15 | 30 days supply | Qty: 30 | Fill #0

## 2020-02-13 DIAGNOSIS — F331 Major depressive disorder, recurrent, moderate: Secondary | ICD-10-CM | POA: Diagnosis not present

## 2020-02-13 DIAGNOSIS — F411 Generalized anxiety disorder: Secondary | ICD-10-CM | POA: Diagnosis not present

## 2020-02-13 DIAGNOSIS — F900 Attention-deficit hyperactivity disorder, predominantly inattentive type: Secondary | ICD-10-CM | POA: Diagnosis not present

## 2020-02-21 MED FILL — HYDROCHLOROTHIAZIDE 12.5 MG: 12.5 | 90 days supply | Qty: 90 | Fill #1

## 2020-03-11 ENCOUNTER — Other Ambulatory Visit (HOSPITAL_BASED_OUTPATIENT_CLINIC_OR_DEPARTMENT_OTHER): Payer: Self-pay

## 2020-03-11 MED FILL — ADDERALL XR 15 MG CAP SA: 15 | 30 days supply | Qty: 30 | Fill #0

## 2020-03-13 ENCOUNTER — Other Ambulatory Visit: Payer: Self-pay | Admitting: Primary Care

## 2020-03-13 DIAGNOSIS — B009 Herpesviral infection, unspecified: Secondary | ICD-10-CM

## 2020-03-13 MED FILL — valACYclovir HCL 1 GM TABS: 1 | 3 days supply | Qty: 12 | Fill #0

## 2020-03-16 ENCOUNTER — Other Ambulatory Visit: Payer: Self-pay

## 2020-03-16 ENCOUNTER — Ambulatory Visit: Payer: Self-pay

## 2020-03-16 ENCOUNTER — Other Ambulatory Visit: Payer: Self-pay | Admitting: Family Medicine

## 2020-03-16 ENCOUNTER — Ambulatory Visit (INDEPENDENT_AMBULATORY_CARE_PROVIDER_SITE_OTHER): Payer: 59 | Admitting: Family Medicine

## 2020-03-16 VITALS — BP 120/70 | Ht 63.0 in | Wt 200.0 lb

## 2020-03-16 DIAGNOSIS — M25521 Pain in right elbow: Secondary | ICD-10-CM

## 2020-03-16 DIAGNOSIS — G5631 Lesion of radial nerve, right upper limb: Secondary | ICD-10-CM | POA: Diagnosis not present

## 2020-03-16 MED ORDER — PREDNISONE 5 MG PO TABS: ORAL_TABLET | ORAL | 0 refills | Status: DC

## 2020-03-16 MED FILL — predniSONE 5 MG TABS: 5 | 6 days supply | Qty: 21 | Fill #0

## 2020-03-16 NOTE — Assessment & Plan Note (Signed)
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  She does have some skin changes associated in the hand when she goes into supination. -Counseled on home exercise therapy and supportive care. -Prednisone. -Could consider compartment testing and provide indications to seek immediate care.

## 2020-03-16 NOTE — Patient Instructions (Signed)
Nice to meet you Please try the exercises. Let me know your response.  Please try the prednisone   Please send me a message in MyChart with any questions or updates.  Please see me back in 2-3 weeks.   --Dr. Raeford Razor

## 2020-03-16 NOTE — Progress Notes (Signed)
Colleen Boone - 50 y.o. female MRN [id number]  Date of birth: 1970/12/15  SUBJECTIVE:  Including CC & ROS.  No chief complaint on file.   Colleen Boone is a 50 y.o. female that is presenting with right forearm and elbow pain.  The pain has been ongoing for a week.  Having pain posteriorly that does radiate to the hand.  No history of similar pain.  Has been moving different forms of furniture and handling things for Christmas.  Also having skin color changes of the hand.  No history of surgery.    Review of Systems See HPI   HISTORY: Past Medical, Surgical, Social, and Family History Reviewed & Updated per EMR.   Pertinent Historical Findings include:  Past Medical History:  Diagnosis Date  . Abnormal Pap smear of cervix   . Accessory ureter   . 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 Anxiety    tapered off sertraline, stable.  . Family history of bladder cancer   . Family history of colon cancer   . Fibroids 12/06/2011   Ultrasound showed: 8.8 x 4.6 x 5.6 cm uterus. 2.0 cm intramural fibroid in the uterine fundus, second intramural 1.7 cm fibroid in the right anterior upper corpus. EM 5 mm, no focal lesion. Normal ovaries.   Marland Kitchen GERD (gastroesophageal reflux disease)   . Hay fever   . Herpes labialis    acid foods and stress incite  . History of gestational diabetes   . HLD (hyperlipidemia)   . HTN (hypertension)   . Obesity   . Uterine polyp   . Vaginal cyst     Past Surgical History:  Procedure Laterality Date  . DILATION AND CURETTAGE OF UTERUS  2007  . TONSILLECTOMY  1975  . TYMPANOSTOMY    . WISDOM TOOTH EXTRACTION      x4    Family History  Problem Relation Age of Onset  . Hypertension Mother   . Diabetes Mother   . Other Mother        NASH with varices  . Liver disease Mother        NASH, ITP  . Colon polyps Mother   . Coronary artery disease Father 49       massive MI  . Cancer Father 31       lymphoma  . Hypertension Father    . Hyperlipidemia Father   . Diabetes Father   . Diabetes Brother   . Deep vein thrombosis Brother   . Colon cancer Maternal Uncle 23  . Stroke Maternal Grandmother        post card cath  . Cancer Maternal Grandmother        leukemia and polycythemia  . Hypertension Maternal Grandmother   . Hypertension Maternal Grandfather   . Heart attack Maternal Grandfather   . Stroke Paternal Grandmother   . Hypertension Paternal Grandmother   . Stroke Paternal Grandfather   . Hypertension Paternal Grandfather   . Heart attack Paternal Grandfather   . Bladder Cancer Maternal Uncle   . Other Sister        MVA  . Breast cancer Neg Hx   . Esophageal cancer Neg Hx   . Stomach cancer Neg Hx   . Rectal cancer Neg Hx     Social History   Socioeconomic History  . Marital status: Single    Spouse name: Not on file  . Number of children: Not on file  . Years of education: Not on  file  . Highest education level: Not on file  Occupational History  . Not on file  Tobacco Use  . Smoking status: Former Research scientist (life sciences)  . Smokeless tobacco: Never Used  . Tobacco comment: was social smoker only  Vaping Use  . Vaping Use: Never used  Substance and Sexual Activity  . Alcohol use: Yes    Comment: 2 glasses red wine nightly  . Drug use: No  . Sexual activity: Not Currently    Partners: Male    Birth control/protection: Condom, Injection    Comment: depo povera  Other Topics Concern  . Not on file  Social History Narrative   Caffeine: 1 cup coffee/day   Lives with son (1998), 2 dogs   Occupation: Therapist, sports at [employer], working on [employer]   Activity: gym 2-3x/wk, cardio, back in school   Diet: not lots of fruits.  Some water.   Social Determinants of Health   Financial Resource Strain: Not on file  Food Insecurity: Not on file  Transportation Needs: Not on file  Physical Activity: Not on file  Stress: Not on file  Social Connections: Not on file  Intimate Partner Violence: Not on file      PHYSICAL EXAM:  VS: BP 120/70   Ht 5\' 3"  (1.6 m)   Wt 200 lb (90.7 kg)   BMI 35.43 kg/m  Physical Exam Gen: NAD, alert, cooperative with exam, well-appearing MSK:  Right elbow: Normal range of motion. Normal strength resistance. Normal supination pronation. Neurovascular intact  Limited ultrasound: Right elbow:  No effusion in the joint. Normal-appearing origin of the common extensors. Normal-appearing radial head. No changes in the posterior compartment. Radial nerve was appreciated going into supinated with no specific changes  Summary: No specific structural changes appreciated  Ultrasound and interpretation by Clearance Coots, MD    ASSESSMENT & PLAN:   Radial nerve compression, right Concern for radial nerve entrapment versus compartment syndrome.  She does have some skin changes associated in the hand when she goes into supination. -Counseled on home exercise therapy and supportive care. -Prednisone. -Could consider compartment testing and provide indications to seek immediate care.

## 2020-03-24 ENCOUNTER — Other Ambulatory Visit (HOSPITAL_BASED_OUTPATIENT_CLINIC_OR_DEPARTMENT_OTHER): Payer: Self-pay

## 2020-03-24 DIAGNOSIS — F900 Attention-deficit hyperactivity disorder, predominantly inattentive type: Secondary | ICD-10-CM | POA: Diagnosis not present

## 2020-03-24 DIAGNOSIS — F331 Major depressive disorder, recurrent, moderate: Secondary | ICD-10-CM | POA: Diagnosis not present

## 2020-03-24 DIAGNOSIS — F411 Generalized anxiety disorder: Secondary | ICD-10-CM | POA: Diagnosis not present

## 2020-04-06 ENCOUNTER — Ambulatory Visit: Payer: 59 | Admitting: Family Medicine

## 2020-04-09 MED FILL — LOSARTAN POTASSIUM 50 MG TA: 50 | 90 days supply | Qty: 90 | Fill #1

## 2020-04-09 MED FILL — ADDERALL XR 15 MG CAP SA: 15 | 30 days supply | Qty: 30 | Fill #0

## 2020-04-09 MED FILL — busPIRone HCL 5 MG TABS: 5 | 30 days supply | Qty: 90 | Fill #1

## 2020-05-21 ENCOUNTER — Other Ambulatory Visit: Payer: Self-pay

## 2020-05-21 ENCOUNTER — Telehealth: Payer: Self-pay

## 2020-05-21 DIAGNOSIS — I10 Essential (primary) hypertension: Secondary | ICD-10-CM

## 2020-05-21 MED ORDER — HYDROCHLOROTHIAZIDE 12.5 MG PO TABS: 12.5000 mg | ORAL_TABLET | Freq: Every day | ORAL | 0 refills | Status: DC

## 2020-05-21 NOTE — Telephone Encounter (Signed)
Pt has changed insurance and had to change pharmacies. Needs HCTZ sent to [facility].

## 2020-05-21 NOTE — Telephone Encounter (Signed)
Sent in new refill and message that needs follow up

## 2020-06-28 ENCOUNTER — Other Ambulatory Visit: Payer: Self-pay

## 2020-06-28 ENCOUNTER — Emergency Department (HOSPITAL_BASED_OUTPATIENT_CLINIC_OR_DEPARTMENT_OTHER): Payer: BC Managed Care – PPO

## 2020-06-28 ENCOUNTER — Encounter (HOSPITAL_BASED_OUTPATIENT_CLINIC_OR_DEPARTMENT_OTHER): Payer: Self-pay

## 2020-06-28 ENCOUNTER — Emergency Department (HOSPITAL_BASED_OUTPATIENT_CLINIC_OR_DEPARTMENT_OTHER)
Admission: EM | Admit: 2020-06-28 | Discharge: 2020-06-28 | Disposition: A | Discharge status: Home / Self Care | Payer: BC Managed Care – PPO | Attending: Emergency Medicine | Admitting: Emergency Medicine

## 2020-06-28 DIAGNOSIS — R1032 Left lower quadrant pain: Secondary | ICD-10-CM | POA: Diagnosis present

## 2020-06-28 DIAGNOSIS — K5732 Diverticulitis of large intestine without perforation or abscess without bleeding: Secondary | ICD-10-CM | POA: Diagnosis not present

## 2020-06-28 DIAGNOSIS — K5792 Diverticulitis of intestine, part unspecified, without perforation or abscess without bleeding: Secondary | ICD-10-CM

## 2020-06-28 DIAGNOSIS — Z87891 Personal history of nicotine dependence: Secondary | ICD-10-CM | POA: Insufficient documentation

## 2020-06-28 DIAGNOSIS — Z91040 Latex allergy status: Secondary | ICD-10-CM | POA: Diagnosis not present

## 2020-06-28 DIAGNOSIS — Z85048 Personal history of other malignant neoplasm of rectum, rectosigmoid junction, and anus: Secondary | ICD-10-CM | POA: Diagnosis not present

## 2020-06-28 DIAGNOSIS — I10 Essential (primary) hypertension: Secondary | ICD-10-CM | POA: Diagnosis not present

## 2020-06-28 DIAGNOSIS — Z79899 Other long term (current) drug therapy: Secondary | ICD-10-CM | POA: Diagnosis not present

## 2020-06-28 LAB — LIPASE, BLOOD: Lipase: 29 U/L (ref 11–51)

## 2020-06-28 LAB — CBC WITH DIFFERENTIAL/PLATELET
Abs Immature Granulocytes: 0.05 10*3/uL (ref 0.00–0.07)
Basophils Absolute: 0.1 10*3/uL (ref 0.0–0.1)
Basophils Relative: 0 %
Eosinophils Absolute: 0.2 10*3/uL (ref 0.0–0.5)
Eosinophils Relative: 1 %
HCT: 43.2 % (ref 36.0–46.0)
Hemoglobin: 14.5 g/dL (ref 12.0–15.0)
Immature Granulocytes: 0 %
Lymphocytes Relative: 12 %
Lymphs Abs: 1.6 10*3/uL (ref 0.7–4.0)
MCH: 31.6 pg (ref 26.0–34.0)
MCHC: 33.6 g/dL (ref 30.0–36.0)
MCV: 94.1 fL (ref 80.0–100.0)
Monocytes Absolute: 1 10*3/uL (ref 0.1–1.0)
Monocytes Relative: 7 %
Neutro Abs: 10.8 10*3/uL — ABNORMAL HIGH (ref 1.7–7.7)
Neutrophils Relative %: 80 %
Platelets: 287 10*3/uL (ref 150–400)
RBC: 4.59 MIL/uL (ref 3.87–5.11)
RDW: 12.4 % (ref 11.5–15.5)
WBC: 13.7 10*3/uL — ABNORMAL HIGH (ref 4.0–10.5)
nRBC: 0 % (ref 0.0–0.2)

## 2020-06-28 LAB — URINALYSIS, ROUTINE W REFLEX MICROSCOPIC
Bilirubin Urine: NEGATIVE
Glucose, UA: NEGATIVE mg/dL
Hgb urine dipstick: NEGATIVE
Ketones, ur: NEGATIVE mg/dL
Leukocytes,Ua: NEGATIVE
Nitrite: NEGATIVE
Protein, ur: NEGATIVE mg/dL
Specific Gravity, Urine: 1.01 (ref 1.005–1.030)
pH: 7 (ref 5.0–8.0)

## 2020-06-28 LAB — COMPREHENSIVE METABOLIC PANEL
ALT: 39 U/L (ref 0–44)
AST: 28 U/L (ref 15–41)
Albumin: 3.5 g/dL (ref 3.5–5.0)
Alkaline Phosphatase: 67 U/L (ref 38–126)
Anion gap: 10 (ref 5–15)
BUN: 6 mg/dL (ref 6–20)
CO2: 23 mmol/L (ref 22–32)
Calcium: 8.3 mg/dL — ABNORMAL LOW (ref 8.9–10.3)
Chloride: 102 mmol/L (ref 98–111)
Creatinine, Ser: 0.63 mg/dL (ref 0.44–1.00)
GFR, Estimated: >60 mL/min (ref >60)
Glucose, Bld: 106 mg/dL — ABNORMAL HIGH (ref 70–99)
Potassium: 3.1 mmol/L — ABNORMAL LOW (ref 3.5–5.1)
Sodium: 135 mmol/L (ref 135–145)
Total Bilirubin: 1.1 mg/dL (ref 0.3–1.2)
Total Protein: 6.8 g/dL (ref 6.5–8.1)

## 2020-06-28 LAB — PREGNANCY, URINE: Preg Test, Ur: NEGATIVE

## 2020-06-28 MED ORDER — AMOXICILLIN-POT CLAVULANATE 875-125 MG PO TABS: 1.0000 | ORAL_TABLET | Freq: Two times a day (BID) | ORAL | 0 refills | Status: AC

## 2020-06-28 MED ORDER — IOHEXOL 300 MG/ML  SOLN
100.0000 mL | Freq: Once | INTRAMUSCULAR | Status: AC
  Administered 2020-06-28: 100 mL via INTRAVENOUS

## 2020-06-28 MED ORDER — AMOXICILLIN-POT CLAVULANATE 875-125 MG PO TABS: 1.0000 | ORAL_TABLET | Freq: Once | ORAL | Status: DC

## 2020-06-28 NOTE — ED Triage Notes (Signed)
LLQ pain x 4 days.  Patient has IUD, spotting, denies discharge.

## 2020-06-28 NOTE — ED Provider Notes (Signed)
[facility] Provider Note   CSN: [id number] Arrival date & time: 06/28/20  1121     History Chief Complaint  Patient presents with  . Abdominal Pain    Colleen Boone is a 50 y.o. female.  The history is provided by the patient.  Abdominal Pain Pain location:  LLQ Pain quality: aching   Pain radiates to:  Does not radiate Pain severity:  Mild Onset quality:  Gradual Timing:  Constant Progression:  Worsening Chronicity:  New Context: not previous surgeries   Relieved by:  Nothing Worsened by:  Nothing Associated symptoms: no anorexia, no chest pain, no chills, no cough, no diarrhea, no dysuria, no fatigue, no fever, no hematuria, no nausea, no shortness of breath, no sore throat, no vaginal discharge and no vomiting   Risk factors: has not had multiple surgeries   Risk factors comment:  Hx of cancerous polyp      Past Medical History:  Diagnosis Date  . Abnormal Pap smear of cervix   . Accessory ureter   . 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 Anxiety    tapered off sertraline, stable.  . Family history of bladder cancer   . Family history of colon cancer   . Fibroids 12/06/2011   Ultrasound showed: 8.8 x 4.6 x 5.6 cm uterus. 2.0 cm intramural fibroid in the uterine fundus, second intramural 1.7 cm fibroid in the right anterior upper corpus. EM 5 mm, no focal lesion. Normal ovaries.   Marland Kitchen GERD (gastroesophageal reflux disease)   . Hay fever   . Herpes labialis    acid foods and stress incite  . History of gestational diabetes   . HLD (hyperlipidemia)   . HTN (hypertension)   . Obesity   . Uterine polyp   . Vaginal cyst     Patient Active Problem List   Diagnosis Date Noted  . Radial nerve compression, right 03/16/2020  . Genetic testing 06/17/2019  . Herpes simplex 06/05/2019  . Family history of colon cancer   . Family history of bladder cancer   . Situational stress 01/18/2019  . Class 2 severe  obesity due to excess calories with serious comorbidity and body mass index (BMI) of 36.0 to 36.9 in adult [facility]) 01/18/2019  . Alcohol abuse, daily use 11/06/2018  . 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 NAFLD (nonalcoholic fatty liver disease) 10/12/2018  . Elevated LFTs 10/12/2018  . GAD (generalized anxiety disorder) 03/08/2017  . ASCUS with positive high risk HPV cervical 09/04/2012  . Prediabetes 01/09/2012  . Preventative health care 01/04/2012  . Fibroids 12/06/2011  . Breakthrough bleeding on depo provera 11/29/2011  . HTN (hypertension)   . HLD (hyperlipidemia)   . Obesity (BMI 30-39.9)   . GERD (gastroesophageal reflux disease)   . Family history of diabetes mellitus   . Family history of early CAD     Past Surgical History:  Procedure Laterality Date  . DILATION AND CURETTAGE OF UTERUS  2007  . TONSILLECTOMY  1975  . TYMPANOSTOMY    . WISDOM TOOTH EXTRACTION      x4     OB History    Gravida  2   Para  1   Term  1   Preterm  0   AB  1   Living  1     SAB  0   IAB  1   Ectopic  0   Multiple  0  Live Births  1           Family History  Problem Relation Age of Onset  . Hypertension Mother   . Diabetes Mother   . Other Mother        NASH with varices  . Liver disease Mother        NASH, ITP  . Colon polyps Mother   . Coronary artery disease Father 52       massive MI  . Cancer Father 63       lymphoma  . Hypertension Father   . Hyperlipidemia Father   . Diabetes Father   . Diabetes Brother   . Deep vein thrombosis Brother   . Colon cancer Maternal Uncle 11  . Stroke Maternal Grandmother        post card cath  . Cancer Maternal Grandmother        leukemia and polycythemia  . Hypertension Maternal Grandmother   . Hypertension Maternal Grandfather   . Heart attack Maternal Grandfather   . Stroke Paternal Grandmother   . Hypertension Paternal Grandmother   . Stroke Paternal  Grandfather   . Hypertension Paternal Grandfather   . Heart attack Paternal Grandfather   . Bladder Cancer Maternal Uncle   . Other Sister        MVA  . Breast cancer Neg Hx   . Esophageal cancer Neg Hx   . Stomach cancer Neg Hx   . Rectal cancer Neg Hx     Social History   Tobacco Use  . Smoking status: Former Research scientist (life sciences)  . Smokeless tobacco: Never Used  . Tobacco comment: was social smoker only  Vaping Use  . Vaping Use: Never used  Substance Use Topics  . Alcohol use: Yes    Comment: 2 glasses red wine nightly  . Drug use: No    Home Medications Prior to Admission medications   Medication Sig Start Date End Date Taking? Authorizing Provider  amoxicillin-clavulanate (AUGMENTIN) 875-125 MG tablet Take 1 tablet by mouth 2 (two) times daily for 10 days. 06/28/20 07/08/20 Yes Markice Torbert, DO  Acetaminophen (TYLENOL) 325 MG CAPS     [provider]  amphetamine-dextroamphetamine (ADDERALL XR) 15 MG 24 hr capsule TAKE ONE TABLET BY MOUTH ONCE DAILY IN THE MORNING AS NEEDED FOR ADD 03/24/20 09/20/20    amphetamine-dextroamphetamine (ADDERALL XR) 15 MG 24 hr capsule TAKE ONE TABLET BY MOUTH ONCE DAILY IN THE MORNING AS NEEDED FOR ADD 03/11/20 09/07/20    amphetamine-dextroamphetamine (ADDERALL XR) 15 MG 24 hr capsule TAKE 1 CAPSULE BY MOUTH EVERY MORNING AS NEEDED FOR ADD 02/04/20 08/02/20    amphetamine-dextroamphetamine (ADDERALL XR) 15 MG 24 hr capsule TAKE 1 TABLET BY MOUTH EVERY MORNING AS NEEDED FOR ADD 12/18/19 06/15/20    buPROPion (WELLBUTRIN) 75 MG tablet Take 75 mg by mouth 2 (two) times daily. 10/17/19   [provider]  busPIRone (BUSPAR) 5 MG tablet TAKE 1 TABLET BY MOUTH 3 TIMES A DAY FOR ANXIETY 02/04/20 02/03/21    busPIRone (BUSPAR) 5 MG tablet TAKE 1 TABLET BY MOUTH TWO TIMES DAILY FOR ANXIETY 11/18/19 11/17/20    esomeprazole (NEXIUM) 20 MG capsule Take 20 mg by mouth daily at 12 noon.    [provider]  hydrochlorothiazide (HYDRODIURIL) 12.5 MG  tablet Take 1 tablet (12.5 mg total) by mouth daily. For blood pressure. 05/21/20   Pleas Koch, NP  Ibuprofen 200 MG CAPS     [provider]  levonorgestrel (MIRENA) 20  MCG/24HR IUD 1 each by Intrauterine route once. Inserted 03/2016    [provider]  losartan (COZAAR) 50 MG tablet TAKE 1 TABLET BY MOUTH DAILY FOR BLOOD PRESSURE 06/27/19 06/26/20  Pleas Koch, NP  predniSONE (DELTASONE) 5 MG tablet TAKE 6 TABS BY MOUTH ON DAY 1; 5 TABS ON DAY 2; 4 TABS ON DAY 3; 3 TABS ON DAY 4; 2 TABS ON DAY 5; 1 TAB ON DAY 6 THEN STOP 03/16/20 03/16/21  Rosemarie Ax, MD  valACYclovir (VALTREX) 1000 MG tablet TAKE 2 TABLETS BY MOUTH 2 TIMES DAILY FOR ONE DAY AT THE FIRST SIGN OF OUTBREAK 03/13/20 03/13/21  Pleas Koch, NP    Allergies    Latex, Aleve [naproxen sodium], and Zocor [simvastatin]  Review of Systems   Review of Systems  Constitutional: Negative for chills, fatigue and fever.  HENT: Negative for ear pain and sore throat.   Eyes: Negative for pain and visual disturbance.  Respiratory: Negative for cough and shortness of breath.   Cardiovascular: Negative for chest pain and palpitations.  Gastrointestinal: Positive for abdominal pain. Negative for abdominal distention, anorexia, diarrhea, nausea, rectal pain and vomiting.  Genitourinary: Negative for decreased urine volume, difficulty urinating, dyspareunia, dysuria, enuresis, flank pain, frequency, genital sores, hematuria, menstrual problem, pelvic pain, urgency, vaginal discharge and vaginal pain.  Musculoskeletal: Negative for arthralgias and back pain.  Skin: Negative for color change, pallor, rash and wound.  Neurological: Negative for seizures and syncope.  All other systems reviewed and are negative.   Physical Exam Updated Vital Signs  ED Triage Vitals  Enc Vitals Group     BP 06/28/20 1128 (!) 146/98     Pulse Rate 06/28/20 1128 92     Resp 06/28/20 1128 18     Temp 06/28/20 1128 98 F (36.7  C)     Temp Source 06/28/20 1128 Oral     SpO2 06/28/20 1128 100 %     Weight 06/28/20 1130 215 lb (97.5 kg)     Height 06/28/20 1130 5\' 3"  (1.6 m)     Head Circumference --      Peak Flow --      Pain Score 06/28/20 1129 3     Pain Loc --      Pain Edu? --      Excl. in [place]? --     Physical Exam Vitals and nursing note reviewed.  Constitutional:      General: She is not in acute distress.    Appearance: She is well-developed. She is not ill-appearing.  HENT:     Head: Normocephalic and atraumatic.     Mouth/Throat:     Mouth: Mucous membranes are moist.  Eyes:     Extraocular Movements: Extraocular movements intact.     Conjunctiva/sclera: Conjunctivae normal.  Cardiovascular:     Rate and Rhythm: Normal rate and regular rhythm.     Heart sounds: Normal heart sounds. No murmur heard.   Pulmonary:     Effort: Pulmonary effort is normal. No respiratory distress.     Breath sounds: Normal breath sounds.  Abdominal:     General: Abdomen is flat. Bowel sounds are normal. There is no distension.     Palpations: Abdomen is soft.     Tenderness: There is no abdominal tenderness. There is no right CVA tenderness or guarding.  Musculoskeletal:     Cervical back: Neck supple.  Skin:    General: Skin is warm and dry.  Capillary Refill: Capillary refill takes less than 2 seconds.  Neurological:     Mental Status: She is alert.     ED Results / Procedures / Treatments   Labs (all labs ordered are listed, but only abnormal results are displayed) Labs Reviewed  CBC WITH DIFFERENTIAL/PLATELET - Abnormal; Notable for the following components:      Result Value   WBC 13.7 (*)    Neutro Abs 10.8 (*)    All other components within normal limits  COMPREHENSIVE METABOLIC PANEL - Abnormal; Notable for the following components:   Potassium 3.1 (*)    Glucose, Bld 106 (*)    Calcium 8.3 (*)    All other components within normal limits  URINE CULTURE  LIPASE, BLOOD   URINALYSIS, ROUTINE W REFLEX MICROSCOPIC  PREGNANCY, URINE    EKG None  Radiology CT ABDOMEN PELVIS W CONTRAST  Result Date: 06/28/2020 CLINICAL DATA:  Left lower quadrant pain X 4 days EXAM: CT ABDOMEN AND PELVIS WITH CONTRAST TECHNIQUE: Multidetector CT imaging of the abdomen and pelvis was performed using the standard protocol following bolus administration of intravenous contrast. CONTRAST:  152mL OMNIPAQUE IOHEXOL 300 MG/ML  SOLN COMPARISON:  11/13/2018 FINDINGS: Lower chest: No pleural or pericardial effusion.  Lung bases clear. Hepatobiliary: No focal liver abnormality is seen. No gallstones, gallbladder wall thickening, or biliary dilatation. Pancreas: Unremarkable. No pancreatic ductal dilatation or surrounding inflammatory changes. Spleen: Normal in size without focal abnormality. Adrenals/Urinary Tract: Adrenal glands unremarkable. Right kidney unremarkable. Focal parenchymal loss in the lower pole left kidney, stable since previous. No hydronephrosis. No urolithiasis. Urinary bladder physiologically distended. Stomach/Bowel: Stomach and small bowel are nondistended. Normal appendix. The colon is nondilated. Scattered descending and sigmoid diverticula. There is localized moderate inflammatory/edematous change around the proximal sigmoid segment. No discrete abscess. Vascular/Lymphatic: No significant vascular findings are present. No enlarged abdominal or pelvic lymph nodes. Reproductive: IUD projects in expected location.  No adnexal mass. Other: No ascites.  Bilateral pelvic phleboliths.  No free air. Musculoskeletal: No acute or significant osseous findings. IMPRESSION: Sigmoid diverticulitis without abscess. Electronically Signed   By: Lucrezia Europe M.D.   On: 06/28/2020 13:16    Procedures Procedures   Medications Ordered in ED Medications  amoxicillin-clavulanate (AUGMENTIN) 875-125 MG per tablet 1 tablet (has no administration in time range)  iohexol (OMNIPAQUE) 300 MG/ML  solution 100 mL (100 mLs Intravenous Contrast Given 06/28/20 1248)    ED Course  I have reviewed the triage vital signs and the nursing notes.  Pertinent labs & imaging results that were available during my care of the patient were reviewed by me and considered in my medical decision making (see chart for details).    MDM Rules/Calculators/A&P                          Colleen Boone is here with left lower abdominal pain.  History of high blood pressure, high cholesterol, history of adenocarcinoma of her rectosigmoid junction that was treated with local removel, states that pain has been increasing over the last 4 days.  Does have a history of fibroids as well.  Is not sure about menstrual cycle status as she does have an IUD.  She has had some spotting.  Denies any discharge or concern for STD.  She is tender mostly in the left lower quadrant.  Suspect possibly diverticulitis versus possibly ovarian cyst.  Could be bladder infection.  Less likely to be a bowel obstruction.  Does have a history of malignancy in this area.  We will get a CT scan to further evaluate.  We will get basic labs.  Pregnancy test is negative.  Mild leukocytosis but otherwise labs are unremarkable.  No AKI.  CT scan shows sigmoid diverticulitis without abscess.  Will treat with Augmentin.  Discharged in good condition.  Understands return precautions.  This chart was dictated using voice recognition software.  Despite best efforts to proofread,  errors can occur which can change the documentation meaning.   Final Clinical Impression(s) / ED Diagnoses Final diagnoses:  Acute diverticulitis    Rx / DC Orders ED Discharge Orders         Ordered    amoxicillin-clavulanate (AUGMENTIN) 875-125 MG tablet  2 times daily        06/28/20 1332           [place], Quita Skye, DO 06/28/20 1332

## 2020-06-28 NOTE — ED Notes (Signed)
Patient transported to CT 

## 2020-06-29 LAB — URINE CULTURE: Culture: NO GROWTH

## 2020-07-10 ENCOUNTER — Other Ambulatory Visit: Payer: Self-pay

## 2020-07-10 DIAGNOSIS — I10 Essential (primary) hypertension: Secondary | ICD-10-CM

## 2020-07-10 NOTE — Telephone Encounter (Signed)
Refill request Losartan Last office visit 06/05/19 Last refill 06/27/19 #90/1 Upcoming appointment 08/29/19

## 2020-07-13 MED ORDER — LOSARTAN POTASSIUM 50 MG PO TABS: 50.0000 mg | ORAL_TABLET | Freq: Every day | ORAL | 0 refills | Status: DC

## 2020-08-05 ENCOUNTER — Other Ambulatory Visit: Payer: Self-pay | Admitting: Primary Care

## 2020-08-05 DIAGNOSIS — I10 Essential (primary) hypertension: Secondary | ICD-10-CM

## 2020-08-13 ENCOUNTER — Other Ambulatory Visit: Payer: Self-pay

## 2020-08-13 DIAGNOSIS — I10 Essential (primary) hypertension: Secondary | ICD-10-CM

## 2020-08-14 ENCOUNTER — Other Ambulatory Visit: Payer: Self-pay | Admitting: Primary Care

## 2020-08-14 DIAGNOSIS — I10 Essential (primary) hypertension: Secondary | ICD-10-CM

## 2020-08-25 ENCOUNTER — Encounter: Payer: Self-pay | Admitting: Internal Medicine

## 2020-08-28 ENCOUNTER — Ambulatory Visit: Payer: BC Managed Care – PPO | Admitting: Primary Care

## 2020-09-04 ENCOUNTER — Other Ambulatory Visit: Payer: Self-pay | Admitting: Primary Care

## 2020-09-04 DIAGNOSIS — I10 Essential (primary) hypertension: Secondary | ICD-10-CM

## 2020-09-07 ENCOUNTER — Other Ambulatory Visit: Payer: Self-pay | Admitting: Primary Care

## 2020-09-07 DIAGNOSIS — I10 Essential (primary) hypertension: Secondary | ICD-10-CM

## 2020-09-11 ENCOUNTER — Encounter: Payer: Self-pay | Admitting: Primary Care

## 2020-09-11 ENCOUNTER — Ambulatory Visit: Payer: BC Managed Care – PPO | Admitting: Primary Care

## 2020-09-11 ENCOUNTER — Other Ambulatory Visit: Payer: Self-pay

## 2020-09-11 VITALS — BP 124/76 | HR 83 | Temp 97.6°F | Ht 63.0 in | Wt 213.0 lb

## 2020-09-11 DIAGNOSIS — K219 Gastro-esophageal reflux disease without esophagitis: Secondary | ICD-10-CM

## 2020-09-11 DIAGNOSIS — Z23 Encounter for immunization: Secondary | ICD-10-CM

## 2020-09-11 DIAGNOSIS — I10 Essential (primary) hypertension: Secondary | ICD-10-CM

## 2020-09-11 DIAGNOSIS — B009 Herpesviral infection, unspecified: Secondary | ICD-10-CM

## 2020-09-11 DIAGNOSIS — F439 Reaction to severe stress, unspecified: Secondary | ICD-10-CM

## 2020-09-11 DIAGNOSIS — F411 Generalized anxiety disorder: Secondary | ICD-10-CM

## 2020-09-11 DIAGNOSIS — E785 Hyperlipidemia, unspecified: Secondary | ICD-10-CM | POA: Diagnosis not present

## 2020-09-11 DIAGNOSIS — Z0000 Encounter for general adult medical examination without abnormal findings: Secondary | ICD-10-CM

## 2020-09-11 DIAGNOSIS — Z800 Family history of malignant neoplasm of digestive organs: Secondary | ICD-10-CM

## 2020-09-11 DIAGNOSIS — F1010 Alcohol abuse, uncomplicated: Secondary | ICD-10-CM

## 2020-09-11 DIAGNOSIS — R7303 Prediabetes: Secondary | ICD-10-CM | POA: Diagnosis not present

## 2020-09-11 DIAGNOSIS — C19 Malignant neoplasm of rectosigmoid junction: Secondary | ICD-10-CM | POA: Diagnosis not present

## 2020-09-11 DIAGNOSIS — Z1231 Encounter for screening mammogram for malignant neoplasm of breast: Secondary | ICD-10-CM

## 2020-09-11 DIAGNOSIS — Z8249 Family history of ischemic heart disease and other diseases of the circulatory system: Secondary | ICD-10-CM

## 2020-09-11 LAB — LIPID PANEL
Cholesterol: 273 mg/dL — ABNORMAL HIGH (ref 0–200)
HDL: 42.8 mg/dL (ref >39.00)
LDL Cholesterol: 192 mg/dL — ABNORMAL HIGH (ref 0–99)
NonHDL: 229.87
Total CHOL/HDL Ratio: 6
Triglycerides: 191 mg/dL — ABNORMAL HIGH (ref 0.0–149.0)
VLDL: 38.2 mg/dL (ref 0.0–40.0)

## 2020-09-11 LAB — CBC
HCT: 43.4 % (ref 36.0–46.0)
Hemoglobin: 14.7 g/dL (ref 12.0–15.0)
MCHC: 33.8 g/dL (ref 30.0–36.0)
MCV: 93 fl (ref 78.0–100.0)
Platelets: 244 10*3/uL (ref 150.0–400.0)
RBC: 4.67 Mil/uL (ref 3.87–5.11)
RDW: 13.5 % (ref 11.5–15.5)
WBC: 6.6 10*3/uL (ref 4.0–10.5)

## 2020-09-11 LAB — COMPREHENSIVE METABOLIC PANEL
ALT: 101 U/L — ABNORMAL HIGH (ref 0–35)
AST: 68 U/L — ABNORMAL HIGH (ref 0–37)
Albumin: 4.5 g/dL (ref 3.5–5.2)
Alkaline Phosphatase: 89 U/L (ref 39–117)
BUN: 13 mg/dL (ref 6–23)
CO2: 28 mEq/L (ref 19–32)
Calcium: 10.1 mg/dL (ref 8.4–10.5)
Chloride: 99 mEq/L (ref 96–112)
Creatinine, Ser: 0.93 mg/dL (ref 0.40–1.20)
GFR: 71.67 mL/min (ref >60.00)
Glucose, Bld: 120 mg/dL — ABNORMAL HIGH (ref 70–99)
Potassium: 3.9 mEq/L (ref 3.5–5.1)
Sodium: 138 mEq/L (ref 135–145)
Total Bilirubin: 0.7 mg/dL (ref 0.2–1.2)
Total Protein: 7.3 g/dL (ref 6.0–8.3)

## 2020-09-11 LAB — HEMOGLOBIN A1C: Hgb A1c MFr Bld: 6.2 % (ref 4.6–6.5)

## 2020-09-11 NOTE — Assessment & Plan Note (Signed)
Infrequent outbreaks, using Valtrex PRN. Continue same.

## 2020-09-11 NOTE — Assessment & Plan Note (Signed)
Following with therapy and psychiatry, continue Buspar 5 mg.

## 2020-09-11 NOTE — Assessment & Plan Note (Signed)
First shingrix provided today. Other vaccines UTD. Mammogram due, orders placed. Colonoscopy overdue, she is scheduled. Pap smear UTD.  Discussed the importance of a healthy diet and regular exercise in order for weight loss, and to reduce the risk of further co-morbidity.  Exam today stable. Labs pending.

## 2020-09-11 NOTE — Progress Notes (Signed)
Subjective:    Patient ID: Colleen Boone, female    DOB: 1970-09-08, 50 y.o.   MRN: [id number]  HPI  Colleen Boone is a very pleasant 50 y.o. female who presents today for follow up of chronic conditions and complete physical.  Family history of colon cancer, following with GI and is overdue for repeat colonoscopy, is scheduled for September 2022. History of heavy alcohol consumption   Following with attention specialist for GAD and concentration. Managed on Adderall XR 15 mg daily, Buspar 5 mg, and is taking once daily.   Immunizations: -Tetanus: 2013 -Influenza: Due this season  -Covid-19: Completed 2 vaccines -Shingles: Never completed  Diet: Fair diet.  Exercise: No regular exercise.  Eye exam: Completes annually  Dental exam: Completes semi-annually   Pap Smear: Completed in 2021, follows with GYN Mammogram: Completed in 2020, due.  Colonoscopy: Completed in 2020, overdue and is scheduled for September 2022.  BP Readings from Last 3 Encounters:  09/11/20 124/76  06/28/20 (!) 132/96  03/16/20 120/70     Review of Systems  Constitutional:  Negative for unexpected weight change.  HENT:  Negative for rhinorrhea.   Respiratory:  Negative for shortness of breath.   Cardiovascular:  Negative for chest pain.  Gastrointestinal:  Negative for constipation and diarrhea.  Genitourinary:  Negative for difficulty urinating and menstrual problem.  Musculoskeletal:  Negative for arthralgias.  Skin:  Negative for rash.  Allergic/Immunologic: Negative for environmental allergies.  Neurological:  Negative for dizziness and headaches.  Psychiatric/Behavioral:  The patient is nervous/anxious.         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   Family history of bladder cancer    Family history of colon cancer     Fibroids 12/06/2011   Ultrasound showed: 8.8 x 4.6 x 5.6 cm uterus. 2.0 cm intramural fibroid in the uterine fundus, second intramural 1.7 cm fibroid in the right anterior upper corpus. EM 5 mm, no focal lesion. Normal ovaries.    GERD (gastroesophageal reflux disease)    Hay fever    Herpes labialis    acid foods and stress incite   History of gestational diabetes    HLD (hyperlipidemia)    HTN (hypertension)    Obesity    Uterine polyp    Vaginal cyst     Social History   Socioeconomic History   Marital status: Single    Spouse name: Not on file   Number of children: Not on file   Years of education: Not on file   Highest education level: Not on file  Occupational History   Not on file  Tobacco Use   Smoking status: Former    Pack years: 0.00   Smokeless tobacco: Never   Tobacco comments:    was social smoker only  Vaping Use   Vaping Use: Never used  Substance and Sexual Activity   Alcohol use: Yes    Comment: 2 glasses red wine nightly   Drug use: No   Sexual activity: Not Currently    Partners: Male    Birth control/protection: Condom, Injection    Comment: depo povera  Other Topics Concern   Not on file  Social History Narrative   Caffeine: 1 cup coffee/day   Lives with son (1998), 2 dogs   Occupation: Therapist, 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  Some water.   Social Determinants of Health   Financial Resource Strain: Not on file  Food Insecurity: Not on file  Transportation Needs: Not on file  Physical Activity: Not on file  Stress: Not on file  Social Connections: Not on file  Intimate Partner Violence: Not on file    Past Surgical History:  Procedure Laterality Date   DILATION AND CURETTAGE OF UTERUS  2007   TONSILLECTOMY  1975   TYMPANOSTOMY     WISDOM TOOTH EXTRACTION      x4    Family History  Problem Relation Age of Onset   Hypertension Mother    Diabetes Mother     Other Mother        NASH with varices   Liver disease Mother        NASH, ITP   Colon polyps Mother    Coronary artery disease Father 84       massive MI   Cancer Father 24       lymphoma   Hypertension Father    Hyperlipidemia Father    Diabetes Father    Diabetes Brother    Deep vein thrombosis Brother    Colon cancer Maternal Uncle 45   Stroke Maternal Grandmother        post card cath   Cancer Maternal Grandmother        leukemia and polycythemia   Hypertension Maternal Grandmother    Hypertension Maternal Grandfather    Heart attack Maternal Grandfather    Stroke Paternal Grandmother    Hypertension Paternal Grandmother    Stroke Paternal Grandfather    Hypertension Paternal Grandfather    Heart attack Paternal Grandfather    Bladder Cancer Maternal Uncle    Other Sister        MVA   Breast cancer Neg Hx    Esophageal cancer Neg Hx    Stomach cancer Neg Hx    Rectal cancer Neg Hx     Allergies  Allergen Reactions   Latex Rash   Aleve [Naproxen Sodium]     Hot flashes   Zocor [Simvastatin] Other (See Comments)    fatigue    Current Outpatient Medications on File Prior to Visit  Medication Sig Dispense Refill   Acetaminophen 325 MG CAPS      amphetamine-dextroamphetamine (ADDERALL XR) 15 MG 24 hr capsule TAKE ONE TABLET BY MOUTH ONCE DAILY IN THE MORNING AS NEEDED FOR ADD 30 capsule 0   busPIRone (BUSPAR) 5 MG tablet TAKE 1 TABLET BY MOUTH TWO TIMES DAILY FOR ANXIETY 60 tablet 0   esomeprazole (NEXIUM) 20 MG capsule Take 20 mg by mouth daily at 12 noon.     hydrochlorothiazide (HYDRODIURIL) 12.5 MG tablet TAKE 1 TABLET (12.5 MG TOTAL) BY MOUTH DAILY. FOR BLOOD PRESSURE. 30 tablet 0   Ibuprofen 200 MG CAPS      levonorgestrel (MIRENA) 20 MCG/24HR IUD 1 each by Intrauterine route once. Inserted 03/2016     losartan (COZAAR) 50 MG tablet TAKE 1 TABLET BY MOUTH EVERY DAY FOR BLOOD PRESSURE NEEDS APPT FOR REFILLS 30 tablet 0   valACYclovir (VALTREX) 1000 MG  tablet TAKE 2 TABLETS BY MOUTH 2 TIMES DAILY FOR ONE DAY AT THE FIRST SIGN OF OUTBREAK 12 tablet 0   busPIRone (BUSPAR) 5 MG tablet TAKE 1 TABLET BY MOUTH 3 TIMES A DAY FOR ANXIETY (Patient  taking differently: Take 5 mg by mouth 3 (three) times daily. Only taking once daily) 90 tablet 2   No current facility-administered medications on file prior to visit.    BP 124/76   Pulse 83   Temp 97.6 F (36.4 C) (Temporal)   Ht 5\' 3"  (1.6 m)   Wt 213 lb (96.6 kg)   SpO2 96%   BMI 37.73 kg/m  Objective:   Physical Exam HENT:     Right Ear: Tympanic membrane and ear canal normal.     Left Ear: Tympanic membrane and ear canal normal.     Nose: Nose normal.  Eyes:     Conjunctiva/sclera: Conjunctivae normal.     Pupils: Pupils are equal, round, and reactive to light.  Neck:     Thyroid: No thyromegaly.  Cardiovascular:     Rate and Rhythm: Normal rate and regular rhythm.     Heart sounds: No murmur heard. Pulmonary:     Effort: Pulmonary effort is normal.     Breath sounds: Normal breath sounds. No rales.  Abdominal:     General: Bowel sounds are normal.     Palpations: Abdomen is soft.     Tenderness: There is no abdominal tenderness.  Musculoskeletal:        General: Normal range of motion.     Cervical back: Neck supple.  Lymphadenopathy:     Cervical: No cervical adenopathy.  Skin:    General: Skin is warm and dry.     Findings: No rash.  Neurological:     Mental Status: She is alert and oriented to person, place, and time.     Cranial Nerves: No cranial nerve deficit.     Deep Tendon Reflexes: Reflexes are normal and symmetric.  Psychiatric:        Mood and Affect: Mood normal.          Assessment & Plan:      This visit occurred during the SARS-CoV-2 public health emergency.  Safety protocols were in place, including screening questions prior to the visit, additional usage of staff PPE, and extensive cleaning of exam room while observing appropriate contact time  as indicated for disinfecting solutions.

## 2020-09-11 NOTE — Assessment & Plan Note (Signed)
Well controlled in the office today, continue HCTZ 12.5 mg and losartan 50 mg daily.   Labs pending

## 2020-09-11 NOTE — Assessment & Plan Note (Signed)
Scheduled for repeat colonoscopy for September 2022.

## 2020-09-11 NOTE — Addendum Note (Signed)
Addended by: Francella Solian on: 09/11/2020 10:07 AM   Modules accepted: Orders

## 2020-09-11 NOTE — Assessment & Plan Note (Signed)
Doing well on Nexium 20 mg, continue same.

## 2020-09-11 NOTE — Assessment & Plan Note (Signed)
Never connected with cardiology. Referral placed today.  Encouraged weight loss, stress reduction. Labs pending today.

## 2020-09-11 NOTE — Assessment & Plan Note (Signed)
Family history of diabetes. Repeat A1C pending.

## 2020-09-11 NOTE — Assessment & Plan Note (Signed)
Working with therapy and psychiatry, somewhat improved. Continue Buspar 5 mg.

## 2020-09-11 NOTE — Assessment & Plan Note (Addendum)
Currently not on treatment.  She does have evidence of atherosclerosis to the great vessels, none noted to coronary vessels.   Repeat lipid panel pending. Will refer to cardiology.

## 2020-09-11 NOTE — Assessment & Plan Note (Signed)
Overdue for repeat colonoscopy, is scheduled for September 2022.

## 2020-09-11 NOTE — Patient Instructions (Signed)
Stop by the lab prior to leaving today. I will notify you of your results once received.   [facility] schedule your [facility].   You will be contacted regarding your referral to cardiology.  Please let us know if you have not been contacted within two weeks.   Schedule a nurse visit to return in 2-6 months for your second shingles vaccine.  It was a pleasure to see you today!  Preventive Care 19-50 Years Old, Female Preventive care refers to lifestyle choices and visits with your health care provider that can promote health and wellness. This includes: A yearly physical exam. This is also called an annual wellness visit. Regular dental and eye exams. Immunizations. Screening for certain conditions. Healthy lifestyle choices, such as: Eating a healthy diet. Getting regular exercise. Not using drugs or products that contain nicotine and tobacco. Limiting alcohol use. What can I expect for my preventive care visit? Physical exam Your health care provider will check your: Height and weight. These may be used to calculate your BMI (body mass index). BMI is a measurement that tells if you are at a healthy weight. Heart rate and blood pressure. Body temperature. Skin for abnormal spots. Counseling Your health care provider may ask you questions about your: Past medical problems. Family's medical history. Alcohol, tobacco, and drug use. Emotional well-being. Home life and relationship well-being. Sexual activity. Diet, exercise, and sleep habits. Work and work Statistician. Access to firearms. Method of birth control. Menstrual cycle. Pregnancy history. What immunizations do I need?  Vaccines are usually given at various ages, according to a schedule. Your health care provider will recommend vaccines for you based on your age, medicalhistory, and lifestyle or other factors, such as travel or where you work. What tests do I need? Blood tests Lipid and cholesterol  levels. These may be checked every 5 years, or more often if you are over 63 years old. Hepatitis C test. Hepatitis B test. Screening Lung cancer screening. You may have this screening every year starting at age 50 if you have a 30-pack-year history of smoking and currently smoke or have quit within the past 15 years. Colorectal cancer screening. All adults should have this screening starting at age 79 and continuing until age 65. Your health care provider may recommend screening at age 81 if you are at increased risk. You will have tests every 1-10 years, depending on your results and the type of screening test. Diabetes screening. This is done by checking your blood sugar (glucose) after you have not eaten for a while (fasting). You may have this done every 1-3 years. Mammogram. This may be done every 1-2 years. Talk with your health care provider about when you should start having regular mammograms. This may depend on whether you have a family history of breast cancer. BRCA-related cancer screening. This may be done if you have a family history of breast, ovarian, tubal, or peritoneal cancers. Pelvic exam and Pap test. This may be done every 3 years starting at age 47. Starting at age 61, this may be done every 5 years if you have a Pap test in combination with an HPV test. Other tests STD (sexually transmitted disease) testing, if you are at risk. Bone density scan. This is done to screen for osteoporosis. You may have this scan if you are at high risk for osteoporosis. Talk with your health care provider about your test results, treatment options,and if necessary, the need for more tests. Follow these  instructions at home: Eating and drinking  Eat a diet that includes fresh fruits and vegetables, whole grains, lean protein, and low-fat dairy products. Take vitamin and mineral supplements as recommended by your health care provider. Do not drink alcohol if: Your health care provider  tells you not to drink. You are pregnant, may be pregnant, or are planning to become pregnant. If you drink alcohol: Limit how much you have to 0-1 drink a day. Be aware of how much alcohol is in your drink. In the [place] one drink equals one 12 oz bottle of beer (355 mL), one 5 oz glass of wine (148 mL), or one 1 oz glass of hard liquor (44 mL).  Lifestyle Take daily care of your teeth and gums. Brush your teeth every morning and night with fluoride toothpaste. Floss one time each day. Stay active. Exercise for at least 30 minutes 5 or more days each week. Do not use any products that contain nicotine or tobacco, such as cigarettes, e-cigarettes, and chewing tobacco. If you need help quitting, ask your health care provider. Do not use drugs. If you are sexually active, practice safe sex. Use a condom or other form of protection to prevent STIs (sexually transmitted infections). If you do not wish to become pregnant, use a form of birth control. If you plan to become pregnant, see your health care provider for a prepregnancy visit. If told by your health care provider, take low-dose aspirin daily starting at age 65. Find healthy ways to cope with stress, such as: Meditation, yoga, or listening to music. Journaling. Talking to a trusted person. Spending time with friends and family. Safety Always wear your seat belt while driving or riding in a vehicle. Do not drive: If you have been drinking alcohol. Do not ride with someone who has been drinking. When you are tired or distracted. While texting. Wear a helmet and other protective equipment during sports activities. If you have firearms in your house, make sure you follow all gun safety procedures. What's next? Visit your health care provider once a year for an annual wellness visit. Ask your health care provider how often you should have your eyes and teeth checked. Stay up to date on all vaccines. This information is not intended to  replace advice given to you by your health care provider. Make sure you discuss any questions you have with your healthcare provider. Document Revised: 11/26/2019 Document Reviewed: 11/02/2017 Elsevier Patient Education  2022 [employer].

## 2020-09-11 NOTE — Assessment & Plan Note (Signed)
Drinking two glasses of wine nightly, encouraged cessation. Repeat labs pending.

## 2020-09-14 ENCOUNTER — Other Ambulatory Visit: Payer: Self-pay

## 2020-09-14 DIAGNOSIS — I10 Essential (primary) hypertension: Secondary | ICD-10-CM

## 2020-09-15 ENCOUNTER — Telehealth: Payer: Self-pay | Admitting: Primary Care

## 2020-09-15 MED ORDER — LOSARTAN POTASSIUM 50 MG PO TABS
50.0000 mg | ORAL_TABLET | Freq: Every day | ORAL | 3 refills | Status: DC
  Filled 2020-12-25: qty 90, 90d supply, fill #0
  Filled 2021-04-05: qty 90, 90d supply, fill #1
  Filled 2021-07-12: qty 90, 90d supply, fill #2

## 2020-09-15 NOTE — Telephone Encounter (Signed)
Error

## 2020-09-18 ENCOUNTER — Encounter: Payer: Self-pay | Admitting: Radiology

## 2020-10-06 ENCOUNTER — Other Ambulatory Visit: Payer: Self-pay | Admitting: Primary Care

## 2020-10-06 DIAGNOSIS — I10 Essential (primary) hypertension: Secondary | ICD-10-CM

## 2020-10-07 ENCOUNTER — Other Ambulatory Visit: Payer: Self-pay

## 2020-10-07 ENCOUNTER — Ambulatory Visit
Admission: RE | Admit: 2020-10-07 | Discharge: 2020-10-07 | Disposition: A | Discharge status: Home / Self Care | Payer: 59 | Source: Ambulatory Visit | Attending: Primary Care | Admitting: Primary Care

## 2020-10-07 DIAGNOSIS — Z1231 Encounter for screening mammogram for malignant neoplasm of breast: Secondary | ICD-10-CM | POA: Diagnosis not present

## 2020-10-30 ENCOUNTER — Other Ambulatory Visit: Payer: Self-pay

## 2020-10-30 ENCOUNTER — Ambulatory Visit (AMBULATORY_SURGERY_CENTER): Payer: 59

## 2020-10-30 VITALS — Ht 63.0 in | Wt 212.0 lb

## 2020-10-30 DIAGNOSIS — Z800 Family history of malignant neoplasm of digestive organs: Secondary | ICD-10-CM

## 2020-10-30 DIAGNOSIS — Z86010 Personal history of colonic polyps: Secondary | ICD-10-CM

## 2020-10-30 NOTE — Progress Notes (Signed)

## 2020-11-04 ENCOUNTER — Other Ambulatory Visit: Payer: Self-pay | Admitting: *Deleted

## 2020-11-04 MED ORDER — FLUCONAZOLE 150 MG PO TABS: 150.0000 mg | ORAL_TABLET | Freq: Once | ORAL | 3 refills | Status: AC

## 2020-11-13 ENCOUNTER — Encounter: Payer: Self-pay | Admitting: Internal Medicine

## 2020-11-13 ENCOUNTER — Other Ambulatory Visit: Payer: Self-pay

## 2020-11-13 ENCOUNTER — Ambulatory Visit (AMBULATORY_SURGERY_CENTER): Payer: 59 | Admitting: Internal Medicine

## 2020-11-13 VITALS — BP 130/86 | HR 73 | Temp 97.3°F | Resp 15 | Ht 63.0 in | Wt 212.0 lb

## 2020-11-13 DIAGNOSIS — Z86010 Personal history of colonic polyps: Secondary | ICD-10-CM | POA: Diagnosis not present

## 2020-11-13 DIAGNOSIS — Z85038 Personal history of other malignant neoplasm of large intestine: Secondary | ICD-10-CM | POA: Diagnosis not present

## 2020-11-13 DIAGNOSIS — Z1211 Encounter for screening for malignant neoplasm of colon: Secondary | ICD-10-CM | POA: Diagnosis not present

## 2020-11-13 DIAGNOSIS — I10 Essential (primary) hypertension: Secondary | ICD-10-CM | POA: Diagnosis not present

## 2020-11-13 MED ORDER — SODIUM CHLORIDE 0.9 % IV SOLN: 500.0000 mL | Freq: Once | INTRAVENOUS | Status: DC

## 2020-11-13 NOTE — Progress Notes (Signed)
Pt's states no medical or surgical changes since previsit or office visit. 

## 2020-11-13 NOTE — Progress Notes (Signed)
[facility] History and Physical   Primary Care Physician:  Pleas Koch, NP   Reason for Procedure:   Hx colon cancer  Plan:    Surveillance colonoscopy     HPI: Colleen Boone is a 50 y.o. female here for colonoscopy as above.   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   Family history of bladder cancer    Family history of colon cancer    Fibroids 12/06/2011   Ultrasound showed: 8.8 x 4.6 x 5.6 cm uterus. 2.0 cm intramural fibroid in the uterine fundus, second intramural 1.7 cm fibroid in the right anterior upper corpus. EM 5 mm, no focal lesion. Normal ovaries.    GERD (gastroesophageal reflux disease)    Hay fever    Herpes labialis    acid foods and stress incite   History of gestational diabetes    HLD (hyperlipidemia)    HTN (hypertension)    Obesity    Uterine polyp    Vaginal cyst     Past Surgical History:  Procedure Laterality Date   DILATION AND CURETTAGE OF UTERUS  2007   TONSILLECTOMY  1975   TYMPANOSTOMY     WISDOM TOOTH EXTRACTION      x4    Prior to Admission medications   Medication Sig Start Date End Date Taking? Authorizing Provider  Acetaminophen 325 MG CAPS    Yes [provider]  amphetamine-dextroamphetamine (ADDERALL) 10 MG tablet Take 10 mg by mouth daily as needed. 09/01/20  Yes [provider]  busPIRone (BUSPAR) 5 MG tablet TAKE 1 TABLET BY MOUTH 3 TIMES A DAY FOR ANXIETY Patient taking differently: Take 5 mg by mouth 3 (three) times daily. Only taking once daily 02/04/20 02/03/21 Yes   Docusate Sodium (COLACE PO) Take by mouth.   Yes [provider]  esomeprazole (NEXIUM) 20 MG capsule Take 20 mg by mouth daily at 12 noon.   Yes [provider]  Ibuprofen 200 MG CAPS    Yes [provider]  losartan (COZAAR) 50 MG tablet Take 1 tablet (50 mg total) by mouth daily. For blood pressure. 09/15/20  Yes Pleas Koch, NP  amphetamine-dextroamphetamine (ADDERALL XR) 15 MG 24 hr capsule TAKE ONE TABLET BY MOUTH ONCE DAILY IN THE MORNING AS NEEDED FOR ADD 03/24/20 10/30/20    busPIRone (BUSPAR) 5 MG tablet TAKE 1 TABLET BY MOUTH TWO TIMES DAILY FOR ANXIETY Patient not taking: No sig reported 11/18/19 11/17/20    hydrochlorothiazide (HYDRODIURIL) 12.5 MG tablet TAKE 1 TABLET (12.5 MG TOTAL) BY MOUTH DAILY. FOR BLOOD PRESSURE. 10/07/20   Pleas Koch, NP  levonorgestrel (MIRENA) 20 MCG/24HR IUD 1 each by Intrauterine route once. Inserted 03/2016    [provider]  valACYclovir (VALTREX) 1000 MG tablet TAKE 2 TABLETS BY MOUTH 2 TIMES DAILY FOR ONE DAY AT THE FIRST SIGN OF OUTBREAK Patient not taking: No sig reported 03/13/20 03/13/21  Pleas Koch, NP    Current Outpatient Medications  Medication Sig Dispense Refill   Acetaminophen 325 MG CAPS      amphetamine-dextroamphetamine (ADDERALL) 10 MG tablet Take 10 mg by mouth daily as needed.     busPIRone (BUSPAR) 5 MG tablet TAKE 1 TABLET BY MOUTH 3 TIMES A DAY FOR ANXIETY (Patient  taking differently: Take 5 mg by mouth 3 (three) times daily. Only taking once daily) 90 tablet 2   Docusate Sodium (COLACE PO) Take by mouth.     esomeprazole (NEXIUM) 20 MG capsule Take 20 mg by mouth daily at 12 noon.     Ibuprofen 200 MG CAPS      losartan (COZAAR) 50 MG tablet Take 1 tablet (50 mg total) by mouth daily. For blood pressure. 90 tablet 3   amphetamine-dextroamphetamine (ADDERALL XR) 15 MG 24 hr capsule TAKE ONE TABLET BY MOUTH ONCE DAILY IN THE MORNING AS NEEDED FOR ADD 30 capsule 0   busPIRone (BUSPAR) 5 MG tablet TAKE 1 TABLET BY MOUTH TWO TIMES DAILY FOR ANXIETY (Patient not taking: No sig reported) 60 tablet 0   hydrochlorothiazide (HYDRODIURIL) 12.5 MG tablet TAKE 1 TABLET (12.5 MG TOTAL) BY MOUTH DAILY. FOR  BLOOD PRESSURE. 90 tablet 3   levonorgestrel (MIRENA) 20 MCG/24HR IUD 1 each by Intrauterine route once. Inserted 03/2016     valACYclovir (VALTREX) 1000 MG tablet TAKE 2 TABLETS BY MOUTH 2 TIMES DAILY FOR ONE DAY AT THE FIRST SIGN OF OUTBREAK (Patient not taking: No sig reported) 12 tablet 0   Current Facility-Administered Medications  Medication Dose Route Frequency Provider Last Rate Last Admin   0.9 %  sodium chloride infusion  500 mL Intravenous Once Gatha Mayer, MD        Allergies as of 11/13/2020 - Review Complete 11/13/2020  Allergen Reaction Noted   Latex Rash 12/16/2010   Aleve [naproxen sodium]  12/16/2010   Zocor [simvastatin] Other (See Comments) 10/03/2013    Family History  Problem Relation Age of Onset   Hypertension Mother    Diabetes Mother    Other Mother        NASH with varices   Liver disease Mother        NASH, ITP   Colon polyps Mother    Coronary artery disease Father 85       massive MI   Cancer Father 78       lymphoma   Hypertension Father    Hyperlipidemia Father    Diabetes Father    Other Sister        MVA   Colon polyps Brother    Diabetes Brother    Deep vein thrombosis Brother    Colon cancer Maternal Uncle 67   Bladder Cancer Maternal Uncle    Stroke Maternal Grandmother        post card cath   Cancer Maternal Grandmother        leukemia and polycythemia   Hypertension Maternal Grandmother    Hypertension Maternal Grandfather    Heart attack Maternal Grandfather    Stroke Paternal Grandmother    Hypertension Paternal Grandmother    Stroke Paternal Grandfather    Hypertension Paternal Grandfather    Heart attack Paternal Grandfather    Breast cancer Neg Hx    Esophageal cancer Neg Hx    Stomach cancer Neg Hx    Rectal cancer Neg Hx     Social History   Socioeconomic History   Marital status: Single    Spouse name: Not on file   Number of children: Not on file   Years of education: Not on file   Highest education  level: Not on file  Occupational History   Not on file  Tobacco Use   Smoking status: Former   Smokeless tobacco: Never   Tobacco comments:    was  social smoker only  Vaping Use   Vaping Use: Never used  Substance and Sexual Activity   Alcohol use: Yes    Comment: 2 glasses red wine nightly   Drug use: No   Sexual activity: Not Currently    Partners: Male    Birth control/protection: Condom, Injection    Comment: depo povera  Other Topics Concern   Not on file  Social History Narrative   Caffeine: 1 cup coffee/day   Lives with son (1998), 2 dogs   Occupation: Therapist, sports at [employer], working on [employer]   Activity: gym 2-3x/wk, cardio, back in school   Diet: not lots of fruits.  Some water.   Social Determinants of Health   Financial Resource Strain: Not on file  Food Insecurity: Not on file  Transportation Needs: Not on file  Physical Activity: Not on file  Stress: Not on file  Social Connections: Not on file  Intimate Partner Violence: Not on file    Review of Systems:  All other review of systems negative except as mentioned in the HPI.  Physical Exam: Vital signs BP 124/77   Pulse 88   Temp (!) 97.3 F (36.3 C)   Ht '5\' 3"'$  (1.6 m)   Wt 212 lb (96.2 kg)   SpO2 99%   BMI 37.55 kg/m   General:   Alert,  Well-developed, well-nourished, pleasant and cooperative in NAD Lungs:  Clear throughout to auscultation.   Heart:  Regular rate and rhythm; no murmurs, clicks, rubs,  or gallops. Abdomen:  Soft, nontender and nondistended. Normal bowel sounds.   Neuro/Psych:  Alert and cooperative. Normal mood and affect. A and O x 3   '@Chontel Warning'$  Simonne Maffucci, MD, [facility] [phone] (pager) 11/13/2020 2:45 PM@

## 2020-11-13 NOTE — Progress Notes (Signed)
A and O x3. Report to RN. Tolerated MAC anesthesia well. 

## 2020-11-13 NOTE — Op Note (Signed)
[facility] Patient Name: Colleen Boone Procedure Date: 11/13/2020 1:44 PM MRN: [id number] Endoscopist: Gatha Mayer , MD Age: 50 Referring MD:  Date of Birth: 1970-10-24 Gender: Female Account #: [id number] Procedure:                Colonoscopy Indications:              High risk colon cancer surveillance: Personal                            history of colon cancer, Last colonoscopy: 2020 Medicines:                Propofol per Anesthesia, Monitored Anesthesia Care Procedure:                Pre-Anesthesia Assessment:                           - Prior to the procedure, a History and Physical                            was performed, and patient medications and                            allergies were reviewed. The patient's tolerance of                            previous anesthesia was also reviewed. The risks                            and benefits of the procedure and the sedation                            options and risks were discussed with the patient.                            All questions were answered, and informed consent                            was obtained. Prior Anticoagulants: The patient has                            taken no previous anticoagulant or antiplatelet                            agents. ASA Grade Assessment: II - A patient with                            mild systemic disease. After reviewing the risks                            and benefits, the patient was deemed in                            satisfactory condition to undergo the procedure.  After obtaining informed consent, the colonoscope                            was passed under direct vision. Throughout the                            procedure, the patient's blood pressure, pulse, and                            oxygen saturations were monitored continuously. The                            Olympus PCF-H190DL [id number]) Colonoscope was                             introduced through the anus and advanced to the the                            cecum, identified by appendiceal orifice and                            ileocecal valve. The colonoscopy was performed                            without difficulty. The patient tolerated the                            procedure well. The quality of the bowel                            preparation was good. The ileocecal valve,                            appendiceal orifice, and rectum were photographed.                            The bowel preparation used was Miralax via split                            dose instruction. Scope In: 3:01:49 PM Scope Out: 3:12:50 PM Scope Withdrawal Time: 0 hours 7 minutes 44 seconds  Total Procedure Duration: 0 hours 11 minutes 1 second  Findings:                 The perianal and digital rectal examinations were                            normal.                           Multiple diverticula were found in the sigmoid                            colon.  A tattoo was seen in the sigmoid colon.                           The exam was otherwise without abnormality on                            direct and retroflexion views. Complications:            No immediate complications. Estimated Blood Loss:     Estimated blood loss: none. Impression:               - Diverticulosis in the sigmoid colon.                           - A tattoo was seen in the sigmoid colon.                           - The examination was otherwise normal on direct                            and retroflexion views.                           - No specimens collected. Recommendation:           - Patient has a contact number available for                            emergencies. The signs and symptoms of potential                            delayed complications were discussed with the                            patient. Return to normal activities tomorrow.                             Written discharge instructions were provided to the                            patient.                           - Resume previous diet.                           - Continue present medications.                           - Repeat colonoscopy in 3 years for surveillance. Gatha Mayer, MD 11/13/2020 3:21:15 PM This report has been signed electronically.

## 2020-11-13 NOTE — Patient Instructions (Addendum)
No polyps today.  Let's repeat in 3 years.  I appreciate the opportunity to care for you. Gatha Mayer, MD, [facility]  Handout given:  Diverticulosis Resume previous diet Continue current medications Repeat colonoscopy in 3 years  YOU HAD AN ENDOSCOPIC PROCEDURE TODAY AT [facility]:   Refer to the procedure report that was given to you for any specific questions about what was found during the examination.  If the procedure report does not answer your questions, please call your gastroenterologist to clarify.  If you requested that your care partner not be given the details of your procedure findings, then the procedure report has been included in a sealed envelope for you to review at your convenience later.  YOU SHOULD EXPECT: Some feelings of bloating in the abdomen. Passage of more gas than usual.  Walking can help get rid of the air that was put into your GI tract during the procedure and reduce the bloating. If you had a lower endoscopy (such as a colonoscopy or flexible sigmoidoscopy) you may notice spotting of blood in your stool or on the toilet paper. If you underwent a bowel prep for your procedure, you may not have a normal bowel movement for a few days.  Please Note:  You might notice some irritation and congestion in your nose or some drainage.  This is from the oxygen used during your procedure.  There is no need for concern and it should clear up in a day or so.  SYMPTOMS TO REPORT IMMEDIATELY:  Following lower endoscopy (colonoscopy or flexible sigmoidoscopy):  Excessive amounts of blood in the stool  Significant tenderness or worsening of abdominal pains  Swelling of the abdomen that is new, acute  Fever of 100F or higher  For urgent or emergent issues, a gastroenterologist can be reached at any hour by calling [phone]. Do not use MyChart messaging for urgent concerns.   DIET:  We do recommend a small meal at first, but then you may proceed to  your regular diet.  Drink plenty of fluids but you should avoid alcoholic beverages for 24 hours.  ACTIVITY:  You should plan to take it easy for the rest of today and you should NOT DRIVE or use heavy machinery until tomorrow (because of the sedation medicines used during the test).    FOLLOW UP: Our staff will call the number listed on your records 48-72 hours following your procedure to check on you and address any questions or concerns that you may have regarding the information given to you following your procedure. If we do not reach you, we will leave a message.  We will attempt to reach you two times.  During this call, we will ask if you have developed any symptoms of COVID 19. If you develop any symptoms (ie: fever, flu-like symptoms, shortness of breath, cough etc.) before then, please call [phone].  If you test positive for Covid 19 in the 2 weeks post procedure, please call and report this information to [place].    If any biopsies were taken you will be contacted by phone or by letter within the next 1-3 weeks.  Please call us at [phone] if you have not heard about the biopsies in 3 weeks.   SIGNATURES/CONFIDENTIALITY: You and/or your care partner have signed paperwork which will be entered into your electronic medical record.  These signatures attest to the fact that that the information above on your After Visit Summary has been reviewed and  is understood.  Full responsibility of the confidentiality of this discharge information lies with you and/or your care-partner.

## 2020-11-17 ENCOUNTER — Telehealth: Payer: Self-pay | Admitting: *Deleted

## 2020-11-17 ENCOUNTER — Other Ambulatory Visit: Payer: Self-pay

## 2020-11-17 ENCOUNTER — Encounter: Payer: Self-pay | Admitting: Cardiovascular Disease

## 2020-11-17 ENCOUNTER — Ambulatory Visit: Payer: 59 | Admitting: Cardiovascular Disease

## 2020-11-17 VITALS — BP 132/90 | HR 83 | Ht 63.0 in | Wt 220.2 lb

## 2020-11-17 DIAGNOSIS — Z6836 Body mass index (BMI) 36.0-36.9, adult: Secondary | ICD-10-CM | POA: Diagnosis not present

## 2020-11-17 DIAGNOSIS — E782 Mixed hyperlipidemia: Secondary | ICD-10-CM | POA: Diagnosis not present

## 2020-11-17 DIAGNOSIS — R7303 Prediabetes: Secondary | ICD-10-CM | POA: Diagnosis not present

## 2020-11-17 DIAGNOSIS — F411 Generalized anxiety disorder: Secondary | ICD-10-CM | POA: Diagnosis not present

## 2020-11-17 DIAGNOSIS — Z8249 Family history of ischemic heart disease and other diseases of the circulatory system: Secondary | ICD-10-CM

## 2020-11-17 NOTE — Telephone Encounter (Signed)
  Follow up Call-  Call back number 11/13/2020 11/02/2018 10/24/2018  Post procedure Call Back phone  # [phone]  Permission to leave phone message Yes Yes Yes  Some recent data might be hidden     Patient questions:  Do you have a fever, pain , or abdominal swelling? No. Pain Score  0 *  Have you tolerated food without any problems? Yes.    Have you been able to return to your normal activities? Yes.    Do you have any questions about your discharge instructions: Diet   No. Medications  No. Follow up visit  No.  Do you have questions or concerns about your Care? No.  Actions: * If pain score is 4 or above: No action needed, pain <4.  Have you developed a fever since your procedure? no  2.   Have you had an respiratory symptoms (SOB or cough) since your procedure? no  3.   Have you tested positive for COVID 19 since your procedure no  4.   Have you had any family members/close contacts diagnosed with the COVID 19 since your procedure?  no   If yes to any of these questions please route to Joylene John, RN and Joella Prince, RN

## 2020-11-17 NOTE — Patient Instructions (Addendum)
Medication Instructions:  No changes  If you need a refill on your cardiac medications before your next appointment, please call your pharmacy.   Lab work: No new labs needed  Testing/Procedures: No new testing needed  Follow-Up: At [facility], you and your health needs are our priority.  As part of our continuing mission to provide you with exceptional heart care, we have created designated Provider Care Teams.  These Care Teams include your primary Cardiologist (physician) and Advanced Practice Providers (APPs -  Physician Assistants and Nurse Practitioners) who all work together to provide you with the care you need, when you need it.  You will need a follow up appointment as needed  Providers on your designated Care Team:   Murray Hodgkins, NP Christell Faith, PA-C Marrianne Mood, PA-C Cadence Perry, [place]  COVID-19 Vaccine Information can be found at: [email or web address].uk For questions related to vaccine distribution or appointments, please email [email or web address] or call [phone].

## 2020-11-17 NOTE — Progress Notes (Signed)
Cardiology Office Note  Date:  11/17/2020   ID:  Colleen Boone, DOB 1971-01-29, MRN [id number]  PCP:  Pleas Koch, NP   Chief Complaint  Patient presents with   New Patient (Initial Visit)    Ref by Alma Friendly, NP for a strong family history of CAD/hyperlipidemia. Medications reviewed by the patient verbally. Patient c/o LE edema, dizziness, right arm/hand coldness and shortness of breath with over exertion.     HPI:  Ms. Colleen Boone is a 50 year old woman with past medical history of Anxiety/ADD on Adderall Family history coronary disease Morbid obesity Who presents by referral from Dr. Carlis Abbott for consultation of her family history of coronary disease, hyperlipidemia  c/o LE edema, dizziness, right arm/hand coldness and shortness of breath with over exertion.   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  PSH:    Past Surgical History:  Procedure Laterality Date   DILATION AND CURETTAGE OF UTERUS  2007   TONSILLECTOMY  1975   TYMPANOSTOMY     WISDOM TOOTH EXTRACTION      x4    Current Outpatient Medications  Medication Sig Dispense Refill   Acetaminophen 325 MG CAPS       amphetamine-dextroamphetamine (ADDERALL XR) 15 MG 24 hr capsule TAKE ONE TABLET BY MOUTH ONCE DAILY IN THE MORNING AS NEEDED FOR ADD 30 capsule 0   amphetamine-dextroamphetamine (ADDERALL) 10 MG tablet Take 10 mg by mouth daily as needed.     busPIRone (BUSPAR) 5 MG tablet TAKE 1 TABLET BY MOUTH 3 TIMES A DAY FOR ANXIETY (Patient taking differently: Take 5 mg by mouth 3 (three) times daily. Only taking once daily) 90 tablet 2   Docusate Sodium (COLACE PO) Take by mouth.     esomeprazole (NEXIUM) 20 MG capsule Take 20 mg by mouth daily at 12 noon.     hydrochlorothiazide (HYDRODIURIL) 12.5 MG tablet TAKE 1 TABLET (12.5 MG TOTAL) BY MOUTH DAILY. FOR BLOOD PRESSURE. 90 tablet 3   Ibuprofen 200 MG CAPS      levonorgestrel (MIRENA) 20 MCG/24HR IUD 1 each by Intrauterine route once. Inserted 03/2016     losartan (COZAAR) 50 MG tablet Take 1 tablet (50 mg total) by mouth daily. For blood pressure. 90 tablet 3   busPIRone (BUSPAR) 5 MG tablet TAKE 1 TABLET BY MOUTH TWO TIMES DAILY FOR ANXIETY (Patient not taking: No sig reported) 60 tablet 0   valACYclovir (VALTREX) 1000 MG tablet TAKE 2 TABLETS BY MOUTH 2 TIMES DAILY FOR ONE DAY AT THE FIRST SIGN OF OUTBREAK (Patient not taking: No sig reported) 12 tablet 0   No current facility-administered medications for this visit.     Allergies:   Latex, Aleve [naproxen sodium], and Zocor [simvastatin]   Social History:  The patient  reports that she has quit smoking. She has  never used smokeless tobacco. She reports current alcohol use. She reports that she does not use drugs.   Family History:   family history includes Bladder Cancer in her maternal uncle; Cancer in her maternal grandmother; Cancer (age of onset: 25) in her father; Colon cancer (age of onset: 39) in her maternal uncle; Colon polyps in her brother and mother; Coronary artery disease (age of onset: 49) in her father; Deep vein thrombosis in her brother; Diabetes in her brother, father, and mother;  Heart attack in her maternal grandfather and paternal grandfather; Hyperlipidemia in her father; Hypertension in her father, maternal grandfather, maternal grandmother, mother, paternal grandfather, and paternal grandmother; Liver disease in her mother; Other in her mother and sister; Stroke in her maternal grandmother, paternal grandfather, and paternal grandmother.    Review of Systems: Review of Systems  Constitutional: Negative.   HENT: Negative.    Respiratory: Negative.    Cardiovascular: Negative.   Gastrointestinal: Negative.   Musculoskeletal: Negative.   Neurological: Negative.   Psychiatric/Behavioral: Negative.    All other systems reviewed and are negative.   PHYSICAL EXAM: VS:  BP 132/90 (BP Location: Right Arm, Patient Position: Sitting, Cuff Size: Normal)   Pulse 83   Ht '5\' 3"'$  (1.6 m)   Wt 220 lb 4 oz (99.9 kg)   SpO2 97%   BMI 39.02 kg/m  , BMI Body mass index is 39.02 kg/m. GEN: Well nourished, well developed, in no acute distress, obese HEENT: normal Neck: no JVD, carotid bruits, or masses Cardiac: RRR; no murmurs, rubs, or gallops,no edema  Respiratory:  clear to auscultation bilaterally, normal work of breathing GI: soft, nontender, nondistended, + BS MS: no deformity or atrophy Skin: warm and dry, no rash Neuro:  Strength and sensation are intact Psych: euthymic mood, full affect   Recent Labs: 09/11/2020: ALT 101; BUN 13; Creatinine, Ser 0.93; Hemoglobin 14.7; Platelets 244.0; Potassium 3.9; Sodium 138    Lipid Panel Lab Results  Component Value Date   CHOL 273 (H) 09/11/2020   HDL 42.80 09/11/2020   LDLCALC 192 (H) 09/11/2020   TRIG 191.0 (H) 09/11/2020      Wt Readings from Last 3 Encounters:  11/17/20 220 lb 4 oz (99.9 kg)  11/13/20 212 lb (96.2 kg)  10/30/20 212 lb (96.2 kg)     ASSESSMENT AND PLAN:  Problem List Items Addressed This Visit       Cardiology Problems   HLD (hyperlipidemia)   Relevant Orders   EKG 12-Lead      Other   Family history of early CAD - Primary   Relevant Orders   EKG 12-Lead   Prediabetes   Relevant Orders   EKG 12-Lead   GAD (generalized anxiety disorder)   Class 2 severe obesity due to excess calories with serious comorbidity and body mass index (BMI) of 36.0 to 36.9 in adult [facility])   Family history coronary disease Discussed family history Brother, father She seems to be more like her mother who had NASH and diabetes CT scan chest abdomen pelvis reviewed with no significant coronary calcification, minimal aortic atherosclerosis noted No further testing needed  Hyperlipidemia Discussed numbers, given CT scan as detailed above, would recommend lifestyle modification for cholesterol Could consider Zetia, bempedoic acid if desired  Prediabetes We have encouraged continued exercise, careful diet management in an effort to lose weight. Given mother's family history recommended aggressive weight loss      Total encounter time more than 60 minutes  Greater than 50% was  spent in counseling and coordination of care with the patient  Patient seen in consultation for Dr. Carlis Abbott will be referred back to her office for ongoing care of the issues detailed above  Signed, Esmond Plants, M.D., Ph.D. [place], [place]

## 2020-11-18 ENCOUNTER — Ambulatory Visit: Payer: BC Managed Care – PPO | Admitting: Advanced Practice Midwife

## 2020-11-20 ENCOUNTER — Other Ambulatory Visit (HOSPITAL_COMMUNITY): Payer: Self-pay

## 2020-11-20 DIAGNOSIS — F900 Attention-deficit hyperactivity disorder, predominantly inattentive type: Secondary | ICD-10-CM | POA: Diagnosis not present

## 2020-11-20 DIAGNOSIS — F331 Major depressive disorder, recurrent, moderate: Secondary | ICD-10-CM | POA: Diagnosis not present

## 2020-11-20 DIAGNOSIS — F411 Generalized anxiety disorder: Secondary | ICD-10-CM | POA: Diagnosis not present

## 2020-11-20 MED ORDER — AMPHETAMINE-DEXTROAMPHETAMINE 10 MG PO TABS
10.0000 mg | ORAL_TABLET | Freq: Every day | ORAL | 0 refills | Status: DC | PRN
  Filled 2020-11-20: qty 30, 30d supply, fill #0

## 2020-11-20 MED ORDER — AMPHETAMINE-DEXTROAMPHET ER 15 MG PO CP24
15.0000 mg | ORAL_CAPSULE | Freq: Every day | ORAL | 0 refills | Status: DC | PRN
  Filled 2020-11-20: qty 30, 30d supply, fill #0

## 2020-11-30 ENCOUNTER — Other Ambulatory Visit (HOSPITAL_COMMUNITY): Payer: Self-pay

## 2020-11-30 MED ORDER — BUSPIRONE HCL 5 MG PO TABS
5.0000 mg | ORAL_TABLET | Freq: Three times a day (TID) | ORAL | 2 refills | Status: AC | PRN
  Filled 2020-11-30 – 2021-02-22 (×2): qty 90, 30d supply, fill #0
  Filled 2021-08-10: qty 90, 30d supply, fill #1
  Filled 2021-11-09: qty 90, 30d supply, fill #2

## 2020-12-02 ENCOUNTER — Ambulatory Visit (INDEPENDENT_AMBULATORY_CARE_PROVIDER_SITE_OTHER): Payer: 59 | Admitting: Advanced Practice Midwife

## 2020-12-02 ENCOUNTER — Encounter: Payer: Self-pay | Admitting: Advanced Practice Midwife

## 2020-12-02 ENCOUNTER — Other Ambulatory Visit: Payer: Self-pay

## 2020-12-02 ENCOUNTER — Other Ambulatory Visit (HOSPITAL_COMMUNITY)
Admission: RE | Admit: 2020-12-02 | Discharge: 2020-12-02 | Disposition: A | Discharge status: Home / Self Care | Payer: 59 | Source: Ambulatory Visit | Attending: Advanced Practice Midwife | Admitting: Advanced Practice Midwife

## 2020-12-02 VITALS — BP 134/89 | HR 85 | Ht 63.0 in | Wt 215.0 lb

## 2020-12-02 DIAGNOSIS — Z113 Encounter for screening for infections with a predominantly sexual mode of transmission: Secondary | ICD-10-CM | POA: Diagnosis not present

## 2020-12-02 DIAGNOSIS — N939 Abnormal uterine and vaginal bleeding, unspecified: Secondary | ICD-10-CM

## 2020-12-02 DIAGNOSIS — Z975 Presence of (intrauterine) contraceptive device: Secondary | ICD-10-CM | POA: Insufficient documentation

## 2020-12-02 DIAGNOSIS — R1030 Lower abdominal pain, unspecified: Secondary | ICD-10-CM | POA: Diagnosis not present

## 2020-12-02 DIAGNOSIS — Z01419 Encounter for gynecological examination (general) (routine) without abnormal findings: Secondary | ICD-10-CM

## 2020-12-02 DIAGNOSIS — Z124 Encounter for screening for malignant neoplasm of cervix: Secondary | ICD-10-CM

## 2020-12-02 DIAGNOSIS — R102 Pelvic and perineal pain: Secondary | ICD-10-CM | POA: Diagnosis not present

## 2020-12-02 NOTE — Progress Notes (Signed)
GYNECOLOGY ANNUAL PREVENTATIVE CARE ENCOUNTER NOTE  History:     Colleen Boone is a 50 y.o. G29P1011 female here for a routine annual gynecologic exam.  Current complaints: 3-week history of irregular vaginal spotting. Patient believes it started shortly after her colonoscopy (11/13/2020).   Denies abnormal vaginal bleeding, discharge, pelvic pain, problems with intercourse or other gynecologic concerns.  No new sexual partners or exposures.  Patient also c/o bilateral lower abdominal "twinges", onset coinciding with onset of spotting about three weeks ago.   Gynecologic History No LMP recorded. (Menstrual status: IUD). Contraception: IUD Last Pap: 10/2019. Result was normal with negative HPV Last Mammogram: 10/09/2020.  Result was normal Last Colonoscopy: 11/13/2020.  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   Family history of bladder cancer    Family history of colon cancer    Fibroids 12/06/2011   Ultrasound showed: 8.8 x 4.6 x 5.6 cm uterus. 2.0 cm intramural fibroid in the uterine fundus, second intramural 1.7 cm fibroid in the right anterior upper corpus. EM 5 mm, no focal lesion. Normal ovaries.    GERD (gastroesophageal reflux disease)    Hay fever    Herpes labialis    acid foods and stress incite   History of gestational diabetes    HLD (hyperlipidemia)    HTN (hypertension)    Obesity    Uterine polyp    Vaginal cyst     Past Surgical History:  Procedure Laterality Date    DILATION AND CURETTAGE OF UTERUS  2007   TONSILLECTOMY  1975   TYMPANOSTOMY     WISDOM TOOTH EXTRACTION      x4    Current Outpatient Medications on File Prior to Visit  Medication Sig Dispense Refill   Acetaminophen 325 MG CAPS      amphetamine-dextroamphetamine (ADDERALL XR) 15 MG 24 hr capsule Take 1 capsule by mouth daily in the morning as needed for ADD 30 capsule 0   amphetamine-dextroamphetamine (ADDERALL) 10 MG tablet Take 10 mg by mouth daily as needed.     amphetamine-dextroamphetamine (ADDERALL) 10 MG tablet Take 1 tablet (10 mg total) by mouth every day at noon as needed for ADD 30 tablet 0   busPIRone (BUSPAR) 5 MG tablet TAKE 1 TABLET BY MOUTH 3 TIMES A DAY FOR ANXIETY (Patient taking differently: Take 5 mg by mouth 3 (three) times daily. Only taking once daily) 90 tablet 2   busPIRone (BUSPAR) 5 MG tablet Take 1 tablet (5 mg total) by mouth 3 (three) times daily as needed for anxiety 90 tablet 2   Docusate Sodium (COLACE PO) Take by mouth.     esomeprazole (NEXIUM) 20 MG capsule Take 20 mg by mouth daily at 12 noon.     hydrochlorothiazide (HYDRODIURIL) 12.5 MG tablet TAKE 1 TABLET (12.5 MG TOTAL) BY  MOUTH DAILY. FOR BLOOD PRESSURE. 90 tablet 3   Ibuprofen 200 MG CAPS      levonorgestrel (MIRENA) 20 MCG/24HR IUD 1 each by Intrauterine route once. Inserted 03/2016     losartan (COZAAR) 50 MG tablet Take 1 tablet (50 mg total) by mouth daily. For blood pressure. 90 tablet 3   amphetamine-dextroamphetamine (ADDERALL XR) 15 MG 24 hr capsule TAKE ONE TABLET BY MOUTH ONCE DAILY IN THE MORNING AS NEEDED FOR ADD 30 capsule 0   valACYclovir (VALTREX) 1000 MG tablet TAKE 2 TABLETS BY MOUTH 2 TIMES DAILY FOR ONE DAY AT THE FIRST SIGN OF OUTBREAK (Patient not taking: No sig reported) 12 tablet 0   No current facility-administered medications on file prior to visit.    Allergies  Allergen Reactions   Latex Rash   Aleve [Naproxen Sodium]     Hot flashes   Zocor [Simvastatin] Other  (See Comments)    fatigue    Social History:  reports that she has quit smoking. She has never used smokeless tobacco. She reports current alcohol use. She reports that she does not use drugs.  Family History  Problem Relation Age of Onset   Hypertension Mother    Diabetes Mother    Other Mother        NASH with varices   Liver disease Mother        NASH, ITP   Colon polyps Mother    Coronary artery disease Father 67       massive MI   Cancer Father 72       lymphoma   Hypertension Father    Hyperlipidemia Father    Diabetes Father    Other Sister        MVA   Colon polyps Brother    Diabetes Brother    Deep vein thrombosis Brother    Colon cancer Maternal Uncle 67   Bladder Cancer Maternal Uncle    Stroke Maternal Grandmother        post card cath   Cancer Maternal Grandmother        leukemia and polycythemia   Hypertension Maternal Grandmother    Hypertension Maternal Grandfather    Heart attack Maternal Grandfather    Stroke Paternal Grandmother    Hypertension Paternal Grandmother    Stroke Paternal Grandfather    Hypertension Paternal Grandfather    Heart attack Paternal Grandfather    Breast cancer Neg Hx    Esophageal cancer Neg Hx    Stomach cancer Neg Hx    Rectal cancer Neg Hx     The following portions of the patient's history were reviewed and updated as appropriate: allergies, current medications, past family history, past medical history, past social history, past surgical history and problem list.  Review of Systems Pertinent items noted in HPI and remainder of comprehensive ROS otherwise negative.  Physical Exam:  BP 134/89   Pulse 85   Ht 5\' 3"  (1.6 m)   Wt 215 lb (97.5 kg)   BMI 38.09 kg/m  CONSTITUTIONAL: Well-developed, well-nourished female in no acute distress.  HENT:  Normocephalic, atraumatic, External right and left ear normal.  EYES: Conjunctivae and EOM are normal. Pupils are equal, round, and reactive to light. No scleral  icterus.  NECK: Normal range of motion, supple, no masses.  Normal thyroid.  SKIN: Skin is warm and dry. No rash noted. Not diaphoretic. No erythema. No pallor. MUSCULOSKELETAL: Normal range of motion. No tenderness.  No cyanosis, clubbing, or edema. NEUROLOGIC: Alert  and oriented to person, place, and time. Normal reflexes, muscle tone coordination.  PSYCHIATRIC: Normal mood and affect. Normal behavior. Normal judgment and thought content. CARDIOVASCULAR: Normal heart rate noted, regular rhythm RESPIRATORY: Clear to auscultation bilaterally. Effort and breath sounds normal, no problems with respiration noted. BREASTS: Symmetric in size. No masses, tenderness, skin changes, nipple drainage, or lymphadenopathy bilaterally. Performed in the presence of a chaperone. ABDOMEN: Soft, no distention noted.  No tenderness, rebound or guarding.  PELVIC: Normal appearing external genitalia and urethral meatus; normal appearing vaginal mucosa and cervix.  No abnormal vaginal discharge noted.  Pap smear obtained.  Normal uterine size, no other palpable masses, no uterine or adnexal tenderness.  Performed in the presence of a chaperone.   Assessment and Plan:    1. Well woman exam with routine gynecological exam - No acute findings - Up to date on all health surveillance/screening - No abnormal findings on physical exam today  2. Screening for cervical cancer - Remote history ASCUS w/ + HRHPV 08/2012, s/p Colpo 09/2012 - All subsequent paps nomal, patient prefers annual screening - Cytology - PAP  3. Screening for STD (sexually transmitted disease)  - Cervicovaginal ancillary only( [place]) - Hepatitis B surface antigen - Hepatitis C antibody - HIV Antibody (routine testing w rflx) - RPR  4. Pelvic pain - Discussed spectrum of interventions, prefer pelvic scan sometime in the next two weeks - US PELVIC COMPLETE WITH TRANSVAGINAL; Future  5. Vaginal spotting - Not visualized on today's  exam  6. IUD (intrauterine device) in place - Mirena placed 03/2016  7. Lower abdominal pain   Will follow up results of pap smear and manage accordingly. Mammogram due 10/2021 Colon cancer screening is up to date, coordinated by PCP in conjunction with [facility] Routine preventative health maintenance measures emphasized. Please refer to After Visit Summary for other counseling recommendations.   Total visit time: 30 min. Greater than 50% of visit spent in counseling and coordination of care  Mallie Snooks, MSN, CNM Certified Nurse Midwife, [employer] for [employer], [place] 12/02/20 12:14 PM

## 2020-12-02 NOTE — Progress Notes (Signed)
Patient presents for Annual Exam today.  Last pap:10/23/2019 WNL  Mammogram: 10/07/2020 WNL  Family Hx of Breast Cancer: None  Colonoscopy : 11/2020 *Diverticulosis  STD Screening: Pt wants Full panel  Contraception: Mirena     CC: spotting x last 3 wks and notes pelvic discomfort sometimes.Notes recent yeast infection.

## 2020-12-03 LAB — CERVICOVAGINAL ANCILLARY ONLY
Chlamydia: NEGATIVE
Comment: NEGATIVE
Comment: NEGATIVE
Comment: NORMAL
Neisseria Gonorrhea: NEGATIVE
Trichomonas: NEGATIVE

## 2020-12-03 LAB — HIV ANTIBODY (ROUTINE TESTING W REFLEX): HIV Screen 4th Generation wRfx: NONREACTIVE

## 2020-12-03 LAB — HEPATITIS B SURFACE ANTIGEN: Hepatitis B Surface Ag: NEGATIVE

## 2020-12-03 LAB — RPR: RPR Ser Ql: NONREACTIVE

## 2020-12-03 LAB — HEPATITIS C ANTIBODY: Hep C Virus Ab: <0.1 s/co ratio (ref 0.0–0.9)

## 2020-12-08 ENCOUNTER — Other Ambulatory Visit (HOSPITAL_COMMUNITY): Payer: Self-pay

## 2020-12-08 LAB — CYTOLOGY - PAP
Comment: NEGATIVE
Comment: NEGATIVE
Diagnosis: UNDETERMINED — AB
HPV 16: NEGATIVE
HPV 18 / 45: NEGATIVE
High risk HPV: POSITIVE — AB

## 2020-12-09 ENCOUNTER — Ambulatory Visit
Admission: RE | Admit: 2020-12-09 | Discharge: 2020-12-09 | Disposition: A | Discharge status: Home / Self Care | Payer: 59 | Source: Ambulatory Visit | Attending: Advanced Practice Midwife | Admitting: Advanced Practice Midwife

## 2020-12-09 ENCOUNTER — Other Ambulatory Visit: Payer: Self-pay

## 2020-12-09 DIAGNOSIS — R102 Pelvic and perineal pain: Secondary | ICD-10-CM | POA: Insufficient documentation

## 2020-12-09 DIAGNOSIS — D251 Intramural leiomyoma of uterus: Secondary | ICD-10-CM | POA: Diagnosis not present

## 2020-12-09 DIAGNOSIS — N888 Other specified noninflammatory disorders of cervix uteri: Secondary | ICD-10-CM | POA: Diagnosis not present

## 2020-12-14 ENCOUNTER — Other Ambulatory Visit (HOSPITAL_COMMUNITY): Payer: Self-pay

## 2020-12-14 DIAGNOSIS — F900 Attention-deficit hyperactivity disorder, predominantly inattentive type: Secondary | ICD-10-CM | POA: Diagnosis not present

## 2020-12-14 DIAGNOSIS — F331 Major depressive disorder, recurrent, moderate: Secondary | ICD-10-CM | POA: Diagnosis not present

## 2020-12-14 DIAGNOSIS — F411 Generalized anxiety disorder: Secondary | ICD-10-CM | POA: Diagnosis not present

## 2020-12-14 MED ORDER — AMPHETAMINE-DEXTROAMPHET ER 15 MG PO CP24
15.0000 mg | ORAL_CAPSULE | Freq: Every morning | ORAL | 0 refills | Status: DC
  Filled 2020-12-14 – 2020-12-24 (×2): qty 30, 30d supply, fill #0

## 2020-12-24 ENCOUNTER — Other Ambulatory Visit (HOSPITAL_COMMUNITY): Payer: Self-pay

## 2020-12-25 ENCOUNTER — Other Ambulatory Visit (HOSPITAL_COMMUNITY): Payer: Self-pay

## 2020-12-25 MED FILL — Hydrochlorothiazide Tab 12.5 MG: ORAL | 90 days supply | Qty: 90 | Fill #0 | Status: AC

## 2020-12-30 ENCOUNTER — Telehealth: Payer: 59 | Admitting: Primary Care

## 2020-12-30 ENCOUNTER — Other Ambulatory Visit: Payer: Self-pay

## 2020-12-30 ENCOUNTER — Other Ambulatory Visit (HOSPITAL_COMMUNITY): Payer: Self-pay

## 2020-12-30 ENCOUNTER — Encounter: Payer: Self-pay | Admitting: Primary Care

## 2020-12-30 VITALS — BP 130/89 | HR 85 | Ht 63.0 in | Wt 216.0 lb

## 2020-12-30 DIAGNOSIS — Z6836 Body mass index (BMI) 36.0-36.9, adult: Secondary | ICD-10-CM

## 2020-12-30 DIAGNOSIS — R7303 Prediabetes: Secondary | ICD-10-CM

## 2020-12-30 MED ORDER — TIRZEPATIDE 2.5 MG/0.5ML ~~LOC~~ SOAJ
2.5000 mg | SUBCUTANEOUS | 0 refills | Status: DC
  Filled 2020-12-30: qty 2, 28d supply, fill #0

## 2020-12-30 NOTE — Progress Notes (Signed)
Patient ID: Colleen Boone, female    DOB: 1970/06/19, 50 y.o.   MRN: [id number]  Virtual visit completed through [place], a video enabled telemedicine application. Due to national recommendations of social distancing due to COVID-19, a virtual visit is felt to be most appropriate for this patient at this time. Reviewed limitations, risks, security and privacy concerns of performing a virtual visit and the availability of in person appointments. I also reviewed that there may be a patient responsible charge related to this service. The patient agreed to proceed.   Patient location: home Provider location: [place] at [facility], office Persons participating in this virtual visit: patient, provider   If any vitals were documented, they were collected by patient at home unless specified below.    BP 130/89   Pulse 85   Ht 5\' 3"  (1.6 m)   Wt 216 lb (98 kg)   BMI 38.26 kg/m    CC: Discuss Obesity Subjective:   HPI: Colleen Boone is a very pleasant 50 y.o. female with a history of morbid obesity, non-alcoholic fatty liver disease, hypertension, prediabetes presenting on 12/30/2020 to discuss weight loss options.  She sent a message via [place] on 12/25/20 requesting medication treatment for her obesity given development and co-existing co-morbidities. We are meeting today to discuss her history and options.   Long history of obesity, has tried and failed numerous weight loss options including weight loss clinics, Phentermine, calorie counting, Optivia, cutting carbs. She had temporary improvement with these interventions, but regained her weight due to poor dietary choices and stress eating.  Most recently she's been watching her diet somewhat, but struggles in the evening as she snacks quite often. She is not exercising much as she recently started a doctoral program in nursing. She is interested in trying [facility]        Relevant past medical, surgical, family and social  history reviewed and updated as indicated. Interim medical history since our last visit reviewed. Allergies and medications reviewed and updated. Outpatient Medications Prior to Visit  Medication Sig Dispense Refill   Acetaminophen 325 MG CAPS      amphetamine-dextroamphetamine (ADDERALL XR) 15 MG 24 hr capsule Take 1 capsule by mouth every morning as needed for ADD 30 capsule 0   amphetamine-dextroamphetamine (ADDERALL) 10 MG tablet Take 1 tablet (10 mg total) by mouth every day at noon as needed for ADD 30 tablet 0   busPIRone (BUSPAR) 5 MG tablet Take 1 tablet (5 mg total) by mouth 3 (three) times daily as needed for anxiety 90 tablet 2   Docusate Sodium (COLACE PO) Take by mouth.     esomeprazole (NEXIUM) 20 MG capsule Take 20 mg by mouth daily at 12 noon.     hydrochlorothiazide (HYDRODIURIL) 12.5 MG tablet TAKE 1 TABLET (12.5 MG TOTAL) BY MOUTH DAILY. FOR BLOOD PRESSURE. 90 tablet 3   Ibuprofen 200 MG CAPS      levonorgestrel (MIRENA) 20 MCG/24HR IUD 1 each by Intrauterine route once. Inserted 03/2016     losartan (COZAAR) 50 MG tablet Take 1 tablet (50 mg total) by mouth daily. For blood pressure. 90 tablet 3   valACYclovir (VALTREX) 1000 MG tablet TAKE 2 TABLETS BY MOUTH 2 TIMES DAILY FOR ONE DAY AT THE FIRST SIGN OF OUTBREAK 12 tablet 0   amphetamine-dextroamphetamine (ADDERALL XR) 15 MG 24 hr capsule TAKE ONE TABLET BY MOUTH ONCE DAILY IN THE MORNING AS NEEDED FOR ADD 30 capsule 0   amphetamine-dextroamphetamine (ADDERALL XR) 15  MG 24 hr capsule Take 1 capsule by mouth daily in the morning as needed for ADD 30 capsule 0   amphetamine-dextroamphetamine (ADDERALL) 10 MG tablet Take 10 mg by mouth daily as needed.     busPIRone (BUSPAR) 5 MG tablet TAKE 1 TABLET BY MOUTH 3 TIMES A DAY FOR ANXIETY (Patient taking differently: Take 5 mg by mouth 3 (three) times daily. Only taking once daily) 90 tablet 2   No facility-administered medications prior to visit.     Per HPI unless specifically  indicated in ROS section below Review of Systems  Respiratory:  Negative for shortness of breath.   Cardiovascular:  Negative for chest pain.  Gastrointestinal:  Negative for abdominal pain.  Objective:  BP 130/89   Pulse 85   Ht 5\' 3"  (1.6 m)   Wt 216 lb (98 kg)   BMI 38.26 kg/m   Wt Readings from Last 3 Encounters:  12/30/20 216 lb (98 kg)  12/02/20 215 lb (97.5 kg)  11/17/20 220 lb 4 oz (99.9 kg)       Physical exam: General: Alert and oriented x 3, no distress, does not appear sickly  Pulmonary: Speaks in complete sentences without increased work of breathing, no cough during visit.  Psychiatric: Normal mood, thought content, and behavior.     Results for orders placed or performed in visit on 12/02/20  Hepatitis B surface antigen  Result Value Ref Range   Hepatitis B Surface Ag Negative Negative  Hepatitis C antibody  Result Value Ref Range   Hep C Virus Ab <0.1 0.0 - 0.9 s/co ratio  HIV Antibody (routine testing w rflx)  Result Value Ref Range   HIV Screen 4th Generation wRfx Non Reactive Non Reactive  RPR  Result Value Ref Range   RPR Ser Ql Non Reactive Non Reactive  Cervicovaginal ancillary only( [place])  Result Value Ref Range   Neisseria Gonorrhea Negative    Chlamydia Negative    Trichomonas Negative    Comment Normal Reference Range Trichomonas - Negative    Comment Normal Reference [place] Chlamydia - Negative    Comment      Normal Reference Range Neisseria Gonorrhea - Negative  Cytology - PAP  Result Value Ref Range   High risk HPV Positive (A)    HPV 16 Negative    HPV 18 / 45 Negative    Adequacy      Satisfactory for evaluation; transformation zone component PRESENT.   Diagnosis (A)     - Atypical squamous cells of undetermined significance (ASC-US)   Comment Normal Reference Range HPV - Negative    Comment Normal Reference Range HPV 16 18 45 -Negative    Assessment & Plan:   Problem List Items Addressed This Visit       Other    Prediabetes - Primary    Discussed the importance of a healthy diet and regular exercise in order for weight loss, and to reduce the risk of further co-morbidity.  Will be starting Mounjaro weekly for weight loss and glucose reduction.   Follow up in 6 weeks. Repeat A1C during that visit.      Relevant Medications   tirzepatide [facility]) 2.5 MG/0.5ML Pen   Class 2 severe obesity due to excess calories with serious comorbidity and body mass index (BMI) of 36.0 to 36.9 in adult [facility])    Qualities for weight loss treatment, GLP-1 would be a good option given co-morbidities.  We discussed the absolute need to improve her diet  and increase physical activity, she agrees.   Discussed potential side effects.  We will plan to see her back in 6 weeks in person for weight check and follow up.  Rx for Mounjaro 2.5 mg once weekly sent to pharmacy for one month. We will then increase to 5 mg once weekly thereafter.       Relevant Medications   tirzepatide (MOUNJARO) 2.5 MG/0.5ML Pen     Meds ordered this encounter  Medications   tirzepatide (MOUNJARO) 2.5 MG/0.5ML Pen    Sig: Inject 2.5 mg into the skin once a week. For weight and blood sugars.    Dispense:  2 mL    Refill:  0    Order Specific Question:   Supervising Provider    Answer:   BEDSOLE, AMY E [[id number]]    No orders of the defined types were placed in this encounter.   I discussed the assessment and treatment plan with the patient. The patient was provided an opportunity to ask questions and all were answered. The patient agreed with the plan and demonstrated an understanding of the instructions. The patient was advised to call back or seek an in-person evaluation if the symptoms worsen or if the condition fails to improve as anticipated.  Follow up plan:  Start Mounjaro at 2.5 mg once weekly for four weeks, then increase to 5 mg once weekly thereafter.  Schedule a follow up visit in 6 weeks.   It was a pleasure to see  you today!   Pleas Koch, NP

## 2020-12-30 NOTE — Assessment & Plan Note (Signed)
Qualities for weight loss treatment, GLP-1 would be a good option given co-morbidities.  We discussed the absolute need to improve her diet and increase physical activity, she agrees.   Discussed potential side effects.  We will plan to see her back in 6 weeks in person for weight check and follow up.  Rx for Mounjaro 2.5 mg once weekly sent to pharmacy for one month. We will then increase to 5 mg once weekly thereafter.

## 2020-12-30 NOTE — Assessment & Plan Note (Signed)
Discussed the importance of a healthy diet and regular exercise in order for weight loss, and to reduce the risk of further co-morbidity.  Will be starting Mounjaro weekly for weight loss and glucose reduction.   Follow up in 6 weeks. Repeat A1C during that visit.

## 2020-12-30 NOTE — Patient Instructions (Signed)
Start Mounjaro at 2.5 mg once weekly for four weeks, then increase to 5 mg once weekly thereafter.  Schedule a follow up visit in 6 weeks.   It was a pleasure to see you today!

## 2021-01-22 ENCOUNTER — Other Ambulatory Visit (HOSPITAL_COMMUNITY): Payer: Self-pay

## 2021-01-22 MED ORDER — TIRZEPATIDE 5 MG/0.5ML ~~LOC~~ SOAJ
5.0000 mg | SUBCUTANEOUS | 0 refills | Status: DC
  Filled 2021-01-22: qty 2, 28d supply, fill #0

## 2021-01-26 ENCOUNTER — Other Ambulatory Visit (HOSPITAL_COMMUNITY): Payer: Self-pay

## 2021-01-26 MED ORDER — AMPHETAMINE-DEXTROAMPHET ER 15 MG PO CP24
15.0000 mg | ORAL_CAPSULE | Freq: Every morning | ORAL | 0 refills | Status: DC
  Filled 2021-01-26: qty 30, 30d supply, fill #0

## 2021-02-09 ENCOUNTER — Other Ambulatory Visit (HOSPITAL_COMMUNITY): Payer: Self-pay

## 2021-02-09 DIAGNOSIS — F331 Major depressive disorder, recurrent, moderate: Secondary | ICD-10-CM | POA: Diagnosis not present

## 2021-02-09 DIAGNOSIS — F900 Attention-deficit hyperactivity disorder, predominantly inattentive type: Secondary | ICD-10-CM | POA: Diagnosis not present

## 2021-02-09 DIAGNOSIS — F411 Generalized anxiety disorder: Secondary | ICD-10-CM | POA: Diagnosis not present

## 2021-02-09 MED ORDER — AMPHETAMINE-DEXTROAMPHET ER 15 MG PO CP24
15.0000 mg | ORAL_CAPSULE | Freq: Every morning | ORAL | 0 refills | Status: DC
  Filled 2021-02-09 – 2021-02-23 (×3): qty 30, 30d supply, fill #0

## 2021-02-11 ENCOUNTER — Ambulatory Visit: Payer: 59 | Admitting: Primary Care

## 2021-02-19 ENCOUNTER — Other Ambulatory Visit (HOSPITAL_COMMUNITY): Payer: Self-pay

## 2021-02-19 ENCOUNTER — Encounter: Payer: Self-pay | Admitting: Primary Care

## 2021-02-19 ENCOUNTER — Other Ambulatory Visit: Payer: Self-pay

## 2021-02-19 ENCOUNTER — Ambulatory Visit: Payer: 59 | Admitting: Primary Care

## 2021-02-19 VITALS — BP 120/82 | HR 96 | Temp 98.7°F | Ht 63.0 in | Wt 206.0 lb

## 2021-02-19 DIAGNOSIS — R7303 Prediabetes: Secondary | ICD-10-CM

## 2021-02-19 DIAGNOSIS — K760 Fatty (change of) liver, not elsewhere classified: Secondary | ICD-10-CM | POA: Diagnosis not present

## 2021-02-19 DIAGNOSIS — R7989 Other specified abnormal findings of blood chemistry: Secondary | ICD-10-CM

## 2021-02-19 DIAGNOSIS — Z6836 Body mass index (BMI) 36.0-36.9, adult: Secondary | ICD-10-CM

## 2021-02-19 DIAGNOSIS — Z23 Encounter for immunization: Secondary | ICD-10-CM

## 2021-02-19 DIAGNOSIS — E785 Hyperlipidemia, unspecified: Secondary | ICD-10-CM

## 2021-02-19 LAB — LIPID PANEL
Cholesterol: 244 mg/dL — ABNORMAL HIGH (ref 0–200)
HDL: 43 mg/dL (ref >39.00)
LDL Cholesterol: 170 mg/dL — ABNORMAL HIGH (ref 0–99)
NonHDL: 201.2
Total CHOL/HDL Ratio: 6
Triglycerides: 158 mg/dL — ABNORMAL HIGH (ref 0.0–149.0)
VLDL: 31.6 mg/dL (ref 0.0–40.0)

## 2021-02-19 LAB — HEPATIC FUNCTION PANEL
ALT: 50 U/L — ABNORMAL HIGH (ref 0–35)
AST: 32 U/L (ref 0–37)
Albumin: 4.3 g/dL (ref 3.5–5.2)
Alkaline Phosphatase: 72 U/L (ref 39–117)
Bilirubin, Direct: 0.1 mg/dL (ref 0.0–0.3)
Total Bilirubin: 1 mg/dL (ref 0.2–1.2)
Total Protein: 7.1 g/dL (ref 6.0–8.3)

## 2021-02-19 LAB — HEMOGLOBIN A1C: Hgb A1c MFr Bld: 5.6 % (ref 4.6–6.5)

## 2021-02-19 MED ORDER — TIRZEPATIDE 5 MG/0.5ML ~~LOC~~ SOAJ
5.0000 mg | SUBCUTANEOUS | 0 refills | Status: DC
  Filled 2021-02-19 – 2021-02-23 (×2): qty 2, 28d supply, fill #0

## 2021-02-19 NOTE — Addendum Note (Signed)
Addended by: Francella Solian on: 02/19/2021 02:56 PM   Modules accepted: Orders

## 2021-02-19 NOTE — Assessment & Plan Note (Signed)
Commended her on weight loss of 10 pounds!  Repeat LFT's pending.

## 2021-02-19 NOTE — Assessment & Plan Note (Signed)
Repeat A1C pending. Commended her on weight loss of 10 pounds!  Continue Mounjaro 5 mg for an addition month. If no further rashes then increase to 7.5 mg weekly. She agrees and will monitor.

## 2021-02-19 NOTE — Progress Notes (Signed)
Subjective:    Patient ID: Colleen Boone, female    DOB: [birth year], 50 y.o.   MRN: [id number]  HPI  Colleen Boone is a very pleasant 50 y.o. female with a hitsory of type 2 diabetes, hypertension, class 2 severe obesity, hyperlipidemia, NAFLD who presents today for follow up of obesity. She is also due for her second Shingrix vaccine.  She was last evaluated virtually on 12/30/20 requesting weight loss treatment given her obesity and multiple co-morbidities including type 2 diabetes.  We decided to start Mounjaro 2.5 mg weekly for prediabetes treatment in hopes of added effect of weight loss.   Since her last visit she's moved up to the 5 mg dose and has tolerated overall well except for reactions to the injection sites. The reactions are red, circular, flat regions measuring about 1.5 cm in diameter which are non itchy. They occur within a few hours of the injection and will remain for about 1-2 weeks.   She denies throat tightness, SOB, wheezing.   Her appetite has declined overall, mostly snacks in the evening.   Diet currently consists of:  Breakfast: Skips mostly, bagel Lunch: Nuts Dinner: Pita chips and humus, veggies, some protein, salad  Desserts: Occasionally, small portions Beverages: Bubbly water, coffee with creamer  Exercise: Active at work   [facility] Readings from Last 3 Encounters:  02/19/21 206 lb (93.4 kg)  12/30/20 216 lb (98 kg)  12/02/20 215 lb (97.5 kg)   BP Readings from Last 3 Encounters:  02/19/21 120/82  12/30/20 130/89  12/02/20 134/89      Review of Systems  HENT:  Negative for trouble swallowing.   Respiratory:  Negative for chest tightness and wheezing.   Gastrointestinal:  Negative for abdominal pain.  Skin:  Positive for rash.        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   Family history of bladder cancer    Family history of colon cancer    Fibroids 12/06/2011   Ultrasound showed: 8.8 x 4.6 x 5.6 cm uterus. 2.0 cm intramural fibroid in the uterine fundus, second intramural 1.7 cm fibroid in the right anterior upper corpus. EM 5 mm, no focal lesion. Normal ovaries.    GERD (gastroesophageal reflux disease)    Hay fever    Herpes labialis    acid foods and stress incite   History of gestational diabetes    HLD (hyperlipidemia)    HTN (hypertension)    Obesity    Uterine polyp    Vaginal cyst     Social History   Socioeconomic History   Marital status: Single    Spouse name: Not on file   Number of children: Not on file   Years of education: Not on file   Highest education level: Not on file  Occupational History   Not on file  Tobacco Use   Smoking status: Former   Smokeless tobacco: Never   Tobacco comments:    was social smoker only  Vaping Use   Vaping Use: Never used  Substance and Sexual Activity   Alcohol use: Yes    Comment: 2 glasses red wine nightly   Drug use: No   Sexual activity: Not Currently    Partners: Male    Birth control/protection:  I.U.D.  Other Topics Concern   Not on file  Social History Narrative   Caffeine: 1 cup coffee/day   Lives with son (1998), 2 dogs   Occupation: Therapist, sports at [employer], working on [employer]   Activity: gym 2-3x/wk, cardio, back in school   Diet: not lots of fruits.  Some water.   Social Determinants of Health   Financial Resource Strain: Not on file  Food Insecurity: Not on file  Transportation Needs: Not on file  Physical Activity: Not on file  Stress: Not on file  Social Connections: Not on file  Intimate Partner Violence: Not on file    Past Surgical History:  Procedure Laterality Date   DILATION AND CURETTAGE OF UTERUS  2007   TONSILLECTOMY  1975   TYMPANOSTOMY     WISDOM TOOTH EXTRACTION      x4    Family History  Problem Relation Age  of Onset   Hypertension Mother    Diabetes Mother    Other Mother        NASH with varices   Liver disease Mother        NASH, ITP   Colon polyps Mother    Coronary artery disease Father 6       massive MI   Cancer Father 46       lymphoma   Hypertension Father    Hyperlipidemia Father    Diabetes Father    Other Sister        MVA   Colon polyps Brother    Diabetes Brother    Deep vein thrombosis Brother    Colon cancer Maternal Uncle 67   Bladder Cancer Maternal Uncle    Stroke Maternal Grandmother        post card cath   Cancer Maternal Grandmother        leukemia and polycythemia   Hypertension Maternal Grandmother    Hypertension Maternal Grandfather    Heart attack Maternal Grandfather    Stroke Paternal Grandmother    Hypertension Paternal Grandmother    Stroke Paternal Grandfather    Hypertension Paternal Grandfather    Heart attack Paternal Grandfather    Breast cancer Neg Hx    Esophageal cancer Neg Hx    Stomach cancer Neg Hx    Rectal cancer Neg Hx     Allergies  Allergen Reactions   Latex Rash   Aleve [Naproxen Sodium]     Hot flashes   Zocor [Simvastatin] Other (See Comments)    fatigue    Current Outpatient Medications on File Prior to Visit  Medication Sig Dispense Refill   Acetaminophen 325 MG CAPS      amphetamine-dextroamphetamine (ADDERALL XR) 15 MG 24 hr capsule Take 1 capsule by mouth in the morning as needed for ADD 30 capsule 0   amphetamine-dextroamphetamine (ADDERALL) 10 MG tablet Take 1 tablet (10 mg total) by mouth every day at noon as needed for ADD 30 tablet 0   busPIRone (BUSPAR) 5 MG tablet Take 1 tablet (5 mg total) by mouth 3 (three) times daily as needed for anxiety 90 tablet 2   Docusate Sodium (COLACE PO) Take by mouth.     esomeprazole (NEXIUM) 20 MG capsule Take 20 mg by mouth daily at 12 noon.     hydrochlorothiazide (HYDRODIURIL) 12.5 MG tablet TAKE 1 TABLET (12.5 MG TOTAL) BY MOUTH DAILY. FOR BLOOD PRESSURE. 90 tablet  3   Ibuprofen 200 MG CAPS      levonorgestrel (MIRENA) 20 MCG/24HR  IUD 1 each by Intrauterine route once. Inserted 03/2016     losartan (COZAAR) 50 MG tablet Take 1 tablet (50 mg total) by mouth daily. For blood pressure. 90 tablet 3   tirzepatide (MOUNJARO) 5 MG/0.5ML Pen Inject 5 mg into the skin once a week. 2 mL 0   valACYclovir (VALTREX) 1000 MG tablet TAKE 2 TABLETS BY MOUTH 2 TIMES DAILY FOR ONE DAY AT THE FIRST SIGN OF OUTBREAK 12 tablet 0   No current facility-administered medications on file prior to visit.    BP 120/82    Pulse 96    Temp 98.7 F (37.1 C) (Temporal)    Ht 5\' 3"  (1.6 m)    Wt 206 lb (93.4 kg)    SpO2 96%    BMI 36.49 kg/m  Objective:   Physical Exam Cardiovascular:     Rate and Rhythm: Normal rate and regular rhythm.  Pulmonary:     Effort: Pulmonary effort is normal.     Breath sounds: Normal breath sounds.  Musculoskeletal:     Cervical back: Neck supple.  Skin:    General: Skin is warm and dry.     Comments: One red, circular, flat rash measuring about 1.5 cm in diameter to left lower abdomen. One light brown, circular, flat rash measuring about 1.5 cm in diameter to right upper abdomen.  Non tender. No open wounds. No warmth.          Assessment & Plan:      This visit occurred during the SARS-CoV-2 public health emergency.  Safety protocols were in place, including screening questions prior to the visit, additional usage of staff PPE, and extensive cleaning of exam room while observing appropriate contact time as indicated for disinfecting solutions.

## 2021-02-19 NOTE — Patient Instructions (Signed)
Stop by the lab prior to leaving today. I will notify you of your results once received.   Message me when you complete your final month of 5 mg dose. Notify me if the spots worsen.  See you in 3 months!

## 2021-02-19 NOTE — Assessment & Plan Note (Signed)
Commended her on 10 pound weight loss!  Repeat lipid panel pending.

## 2021-02-19 NOTE — Assessment & Plan Note (Signed)
Weight loss of 10 pounds since late October 2022!  Repeat LFT's pending.

## 2021-02-19 NOTE — Assessment & Plan Note (Addendum)
Commended her on weight loss of 10 pounds! Repeat A1C and LFT's pending.  Continue Mounjaro 5 mg for an addition month. If no further rashes then increase to 7.5 mg weekly. She agrees and will monitor.   Follow up in 3 months.

## 2021-02-22 ENCOUNTER — Other Ambulatory Visit: Payer: Self-pay | Admitting: Primary Care

## 2021-02-22 ENCOUNTER — Other Ambulatory Visit (HOSPITAL_COMMUNITY): Payer: Self-pay

## 2021-02-23 ENCOUNTER — Other Ambulatory Visit (HOSPITAL_COMMUNITY): Payer: Self-pay

## 2021-02-23 NOTE — Telephone Encounter (Signed)
Left message to return call to our office.  

## 2021-02-23 NOTE — Telephone Encounter (Signed)
Did the pharmacy receive the refill I sent last week?

## 2021-02-26 ENCOUNTER — Ambulatory Visit: Payer: 59 | Admitting: Primary Care

## 2021-03-02 ENCOUNTER — Other Ambulatory Visit (HOSPITAL_COMMUNITY): Payer: Self-pay

## 2021-03-28 MED ORDER — SEMAGLUTIDE-WEIGHT MANAGEMENT 0.5 MG/0.5ML ~~LOC~~ SOAJ
0.5000 mg | SUBCUTANEOUS | 0 refills | Status: DC
  Filled 2021-03-28: qty 2, 28d supply, fill #0
  Filled 2021-03-29 – 2021-03-31 (×2): qty 6, 84d supply, fill #0
  Filled 2021-04-06: qty 2, 28d supply, fill #0

## 2021-03-29 ENCOUNTER — Other Ambulatory Visit (HOSPITAL_COMMUNITY): Payer: Self-pay

## 2021-03-30 DIAGNOSIS — F331 Major depressive disorder, recurrent, moderate: Secondary | ICD-10-CM | POA: Diagnosis not present

## 2021-03-30 DIAGNOSIS — F900 Attention-deficit hyperactivity disorder, predominantly inattentive type: Secondary | ICD-10-CM | POA: Diagnosis not present

## 2021-03-30 DIAGNOSIS — F411 Generalized anxiety disorder: Secondary | ICD-10-CM | POA: Diagnosis not present

## 2021-03-31 ENCOUNTER — Other Ambulatory Visit (HOSPITAL_COMMUNITY): Payer: Self-pay

## 2021-04-02 NOTE — Telephone Encounter (Signed)
Spoke with patient, we agreed to hold off on Mounjaro to see if Mancel Parsons is approved early next week.  Joellen, will you check on this Monday?

## 2021-04-05 ENCOUNTER — Other Ambulatory Visit (HOSPITAL_COMMUNITY): Payer: Self-pay

## 2021-04-05 MED FILL — Hydrochlorothiazide Tab 12.5 MG: ORAL | 90 days supply | Qty: 90 | Fill #1 | Status: AC

## 2021-04-05 NOTE — Telephone Encounter (Signed)
Received authorization for Wegovy 0.5 mg/0.5 ml pen effective for maximum  3 of 3 refills from 03-30-21 to 06/29/21. Ppw placed in your folder for review.

## 2021-04-06 ENCOUNTER — Other Ambulatory Visit (HOSPITAL_COMMUNITY): Payer: Self-pay

## 2021-04-06 MED ORDER — AMPHETAMINE-DEXTROAMPHET ER 15 MG PO CP24
15.0000 mg | ORAL_CAPSULE | Freq: Every morning | ORAL | 0 refills | Status: DC
  Filled 2021-04-06: qty 30, 30d supply, fill #0

## 2021-04-12 ENCOUNTER — Other Ambulatory Visit (HOSPITAL_COMMUNITY): Payer: Self-pay

## 2021-04-26 DIAGNOSIS — R7303 Prediabetes: Secondary | ICD-10-CM

## 2021-04-28 MED ORDER — TIRZEPATIDE 5 MG/0.5ML ~~LOC~~ SOAJ
5.0000 mg | SUBCUTANEOUS | 0 refills | Status: DC
  Filled 2021-04-28: qty 2, 28d supply, fill #0

## 2021-04-29 ENCOUNTER — Other Ambulatory Visit (HOSPITAL_COMMUNITY): Payer: Self-pay

## 2021-05-18 ENCOUNTER — Other Ambulatory Visit (HOSPITAL_COMMUNITY): Payer: Self-pay

## 2021-05-18 DIAGNOSIS — F900 Attention-deficit hyperactivity disorder, predominantly inattentive type: Secondary | ICD-10-CM | POA: Diagnosis not present

## 2021-05-18 DIAGNOSIS — F331 Major depressive disorder, recurrent, moderate: Secondary | ICD-10-CM | POA: Diagnosis not present

## 2021-05-18 DIAGNOSIS — F411 Generalized anxiety disorder: Secondary | ICD-10-CM | POA: Diagnosis not present

## 2021-05-18 MED ORDER — AMPHETAMINE-DEXTROAMPHET ER 15 MG PO CP24
15.0000 mg | ORAL_CAPSULE | Freq: Every day | ORAL | 0 refills | Status: DC | PRN
  Filled 2021-05-18: qty 30, 30d supply, fill #0

## 2021-05-20 ENCOUNTER — Ambulatory Visit: Payer: 59 | Admitting: Primary Care

## 2021-05-25 ENCOUNTER — Encounter: Payer: Self-pay | Admitting: Advanced Practice Midwife

## 2021-05-27 ENCOUNTER — Other Ambulatory Visit (HOSPITAL_COMMUNITY)
Admission: RE | Admit: 2021-05-27 | Discharge: 2021-05-27 | Disposition: A | Discharge status: Home / Self Care | Payer: 59 | Source: Ambulatory Visit | Attending: Advanced Practice Midwife | Admitting: Advanced Practice Midwife

## 2021-05-27 ENCOUNTER — Ambulatory Visit (INDEPENDENT_AMBULATORY_CARE_PROVIDER_SITE_OTHER): Payer: 59 | Admitting: *Deleted

## 2021-05-27 ENCOUNTER — Other Ambulatory Visit: Payer: Self-pay

## 2021-05-27 DIAGNOSIS — Z113 Encounter for screening for infections with a predominantly sexual mode of transmission: Secondary | ICD-10-CM

## 2021-05-27 NOTE — Progress Notes (Cosign Needed)
SUBJECTIVE:  ?51 y.o. female who desires a STI screen. ?Denies abnormal vaginal discharge, bleeding or significant pelvic pain. No UTI symptoms. Denies history of known exposure to STD. ? ?No LMP recorded. (Menstrual status: IUD). ? ?OBJECTIVE:  ?She appears well. ? ? ?ASSESSMENT:  ?STI Screen  ? ?PLAN:  ?Pt offered STI blood screening-requested ?GC, chlamydia, and trichomonas probe sent to lab.  ?Treatment: To be determined once lab results are received. ? ?Pt follow up as needed.  ?

## 2021-05-27 NOTE — Progress Notes (Signed)
ATTESTATION OF SUPERVISION OF RN: Evaluation and management procedures were performed by the RN under my supervision and collaboration. I have reviewed the nursing note and chart and agree with the management and plan for this patient.  Mychal Decarlo, CNM  

## 2021-05-28 ENCOUNTER — Other Ambulatory Visit: Payer: Self-pay | Admitting: Primary Care

## 2021-05-28 ENCOUNTER — Other Ambulatory Visit (HOSPITAL_COMMUNITY): Payer: Self-pay

## 2021-05-28 DIAGNOSIS — R7303 Prediabetes: Secondary | ICD-10-CM

## 2021-05-28 LAB — RPR: RPR Ser Ql: NONREACTIVE

## 2021-05-28 LAB — CERVICOVAGINAL ANCILLARY ONLY
Chlamydia: NEGATIVE
Comment: NEGATIVE
Comment: NEGATIVE
Comment: NORMAL
Neisseria Gonorrhea: NEGATIVE
Trichomonas: NEGATIVE

## 2021-05-28 LAB — HEPATITIS C ANTIBODY: Hep C Virus Ab: NONREACTIVE

## 2021-05-28 LAB — HSV 2 ANTIBODY, IGG: HSV 2 IgG, Type Spec: <0.91 index (ref 0.00–0.90)

## 2021-05-28 LAB — HEPATITIS B SURFACE ANTIGEN: Hepatitis B Surface Ag: NEGATIVE

## 2021-05-28 LAB — HIV ANTIBODY (ROUTINE TESTING W REFLEX): HIV Screen 4th Generation wRfx: NONREACTIVE

## 2021-05-28 MED ORDER — TIRZEPATIDE 7.5 MG/0.5ML ~~LOC~~ SOAJ
7.5000 mg | SUBCUTANEOUS | 0 refills | Status: DC
Start: 2021-05-28 — End: 2021-05-29
  Filled 2021-05-28: qty 2, 28d supply, fill #0

## 2021-05-29 MED ORDER — MOUNJARO 5 MG/0.5ML ~~LOC~~ SOAJ
5.0000 mg | SUBCUTANEOUS | 0 refills | Status: DC
  Filled 2021-05-29: qty 6, 84d supply, fill #0

## 2021-05-31 ENCOUNTER — Other Ambulatory Visit (HOSPITAL_COMMUNITY): Payer: Self-pay

## 2021-06-01 ENCOUNTER — Other Ambulatory Visit (HOSPITAL_COMMUNITY): Payer: Self-pay

## 2021-06-01 MED ORDER — ACYCLOVIR 5 % EX OINT
TOPICAL_OINTMENT | Freq: Every day | CUTANEOUS | 0 refills | Status: AC
Start: 2021-06-01 — End: ?
  Filled 2021-06-01: qty 15, 30d supply, fill #0

## 2021-06-09 ENCOUNTER — Ambulatory Visit: Payer: 59 | Admitting: Primary Care

## 2021-06-28 ENCOUNTER — Other Ambulatory Visit (HOSPITAL_COMMUNITY): Payer: Self-pay

## 2021-06-28 MED ORDER — AMPHETAMINE-DEXTROAMPHETAMINE 15 MG PO TABS
15.0000 mg | ORAL_TABLET | Freq: Two times a day (BID) | ORAL | 0 refills | Status: DC
Start: 2021-06-26 — End: 2021-07-07
  Filled 2021-06-28: qty 60, 30d supply, fill #0

## 2021-07-01 ENCOUNTER — Encounter: Payer: Self-pay | Admitting: Advanced Practice Midwife

## 2021-07-06 ENCOUNTER — Other Ambulatory Visit (HOSPITAL_COMMUNITY): Payer: Self-pay

## 2021-07-06 DIAGNOSIS — F900 Attention-deficit hyperactivity disorder, predominantly inattentive type: Secondary | ICD-10-CM | POA: Diagnosis not present

## 2021-07-06 DIAGNOSIS — F411 Generalized anxiety disorder: Secondary | ICD-10-CM | POA: Diagnosis not present

## 2021-07-06 DIAGNOSIS — F331 Major depressive disorder, recurrent, moderate: Secondary | ICD-10-CM | POA: Diagnosis not present

## 2021-07-06 MED FILL — Hydrochlorothiazide Tab 12.5 MG: ORAL | 90 days supply | Qty: 90 | Fill #2 | Status: AC

## 2021-07-07 ENCOUNTER — Other Ambulatory Visit (HOSPITAL_COMMUNITY): Payer: Self-pay

## 2021-07-07 MED ORDER — AMPHETAMINE-DEXTROAMPHETAMINE 15 MG PO TABS
15.0000 mg | ORAL_TABLET | Freq: Two times a day (BID) | ORAL | 0 refills | Status: AC
  Filled 2021-07-07 – 2021-08-11 (×3): qty 60, 30d supply, fill #0

## 2021-07-08 ENCOUNTER — Other Ambulatory Visit: Payer: Self-pay | Admitting: Primary Care

## 2021-07-08 ENCOUNTER — Other Ambulatory Visit (HOSPITAL_COMMUNITY): Payer: Self-pay

## 2021-07-08 DIAGNOSIS — R7303 Prediabetes: Secondary | ICD-10-CM

## 2021-07-09 ENCOUNTER — Other Ambulatory Visit (HOSPITAL_COMMUNITY): Payer: Self-pay

## 2021-07-09 ENCOUNTER — Ambulatory Visit: Payer: 59 | Admitting: Primary Care

## 2021-07-12 ENCOUNTER — Other Ambulatory Visit (HOSPITAL_COMMUNITY): Payer: Self-pay

## 2021-07-14 ENCOUNTER — Other Ambulatory Visit (HOSPITAL_COMMUNITY): Payer: Self-pay

## 2021-07-29 ENCOUNTER — Encounter: Payer: Self-pay | Admitting: Primary Care

## 2021-07-29 ENCOUNTER — Ambulatory Visit: Payer: 59 | Admitting: Primary Care

## 2021-07-29 ENCOUNTER — Other Ambulatory Visit (HOSPITAL_COMMUNITY): Payer: Self-pay

## 2021-07-29 ENCOUNTER — Telehealth: Payer: Self-pay

## 2021-07-29 VITALS — BP 126/72 | HR 81 | Temp 98.6°F | Ht 63.0 in | Wt 187.0 lb

## 2021-07-29 DIAGNOSIS — Z6833 Body mass index (BMI) 33.0-33.9, adult: Secondary | ICD-10-CM

## 2021-07-29 DIAGNOSIS — E6609 Other obesity due to excess calories: Secondary | ICD-10-CM | POA: Diagnosis not present

## 2021-07-29 MED ORDER — TIRZEPATIDE 7.5 MG/0.5ML ~~LOC~~ SOAJ
7.5000 mg | SUBCUTANEOUS | 0 refills | Status: DC
  Filled 2021-07-29: qty 6, 84d supply, fill #0
  Filled 2021-08-10: qty 2, 28d supply, fill #0

## 2021-07-29 NOTE — Telephone Encounter (Signed)
Prior auth started for [facility] 7.'5MG'$ /0.5ML pen-injectors. Edd Arbour (Key: [id number]) Rx #: [id number] Waiting for determination.

## 2021-07-29 NOTE — Progress Notes (Signed)
Subjective:    Patient ID: Colleen Boone, female    DOB: [birth year], 51 y.o.   MRN: [id number]  HPI  Colleen Boone is a very pleasant 51 y.o. female with a history of hypertension, nonalcoholic fatty liver disease, hyperlipidemia, prediabetes, obesity, elevated LFT's who presents today for follow up of obesity.  Currently managed on Mounjaro 5 mg weekly for which was originally initiated in December 2022.  She initially experienced an allergic reaction with rashes to her skin with [facility] so she was switched to [facility].  Unfortunately she did not see great results with Mancel Parsons so she requested to switch back to [facility] We attempted to increase her dose to 7.5 mg weekly but this dose was on backorder. She is currently injecting 5 mg weekly now.  Since her last visit she is compliant to Mounjaro 5 mg weekly, she continues to notice positive results. She has not noticed rashes or reactions at the injection sites. She would like to proceed with a dose increase to 7.5 mg weekly. She has no concerns today.  Diet currently consists of:  Breakfast: Skips  Lunch: Skips, cheese and crackers Dinner: Protein and vegetables Snacks: Infrequent Desserts: Occasional candy Beverages: Water mostly, coffee, 2 glasses of wine at night  Exercise: Active at work. Some walking   Wt Readings from Last 3 Encounters:  07/29/21 187 lb (84.8 kg)  02/19/21 206 lb (93.4 kg)  12/30/20 216 lb (98 kg)        Review of Systems  Respiratory:  Negative for shortness of breath.   Cardiovascular:  Negative for chest pain.  Gastrointestinal:  Positive for constipation. Negative for abdominal pain and nausea.        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   Family history of bladder cancer    Family history of  colon cancer    Fibroids 12/06/2011   Ultrasound showed: 8.8 x 4.6 x 5.6 cm uterus. 2.0 cm intramural fibroid in the uterine fundus, second intramural 1.7 cm fibroid in the right anterior upper corpus. EM 5 mm, no focal lesion. Normal ovaries.    GERD (gastroesophageal reflux disease)    Hay fever    Herpes labialis    acid foods and stress incite   History of gestational diabetes    HLD (hyperlipidemia)    HTN (hypertension)    Obesity    Uterine polyp    Vaginal cyst     Social History   Socioeconomic History   Marital status: Single    Spouse name: Not on file   Number of children: Not on file   Years of education: Not on file   Highest education level: Not on file  Occupational History   Not on file  Tobacco Use   Smoking status: Former   Smokeless tobacco: Never   Tobacco comments:    was social smoker only  Vaping Use   Vaping Use: Never used  Substance and Sexual Activity   Alcohol use: Yes    Comment: 2 glasses red wine nightly   Drug use: No   Sexual activity: Not Currently    Partners: Male    Birth control/protection: I.U.D.  Other Topics Concern   Not on file  Social History Narrative   Caffeine: 1 cup coffee/day  Lives with son (1998), 2 dogs   Occupation: Therapist, sports at [employer], working on [employer]   Activity: gym 2-3x/wk, cardio, back in school   Diet: not lots of fruits.  Some water.   Social Determinants of Health   Financial Resource Strain: Not on file  Food Insecurity: Not on file  Transportation Needs: Not on file  Physical Activity: Not on file  Stress: Not on file  Social Connections: Not on file  Intimate Partner Violence: Not on file    Past Surgical History:  Procedure Laterality Date   DILATION AND CURETTAGE OF UTERUS  2007   TONSILLECTOMY  1975   TYMPANOSTOMY     WISDOM TOOTH EXTRACTION      x4    Family History  Problem Relation Age of Onset   Hypertension Mother    Diabetes Mother    Other Mother        NASH with  varices   Liver disease Mother        NASH, ITP   Colon polyps Mother    Coronary artery disease Father 52       massive MI   Cancer Father 70       lymphoma   Hypertension Father    Hyperlipidemia Father    Diabetes Father    Other Sister        MVA   Colon polyps Brother    Diabetes Brother    Deep vein thrombosis Brother    Colon cancer Maternal Uncle 67   Bladder Cancer Maternal Uncle    Stroke Maternal Grandmother        post card cath   Cancer Maternal Grandmother        leukemia and polycythemia   Hypertension Maternal Grandmother    Hypertension Maternal Grandfather    Heart attack Maternal Grandfather    Stroke Paternal Grandmother    Hypertension Paternal Grandmother    Stroke Paternal Grandfather    Hypertension Paternal Grandfather    Heart attack Paternal Grandfather    Breast cancer Neg Hx    Esophageal cancer Neg Hx    Stomach cancer Neg Hx    Rectal cancer Neg Hx     Allergies  Allergen Reactions   Latex Rash   Aleve [Naproxen Sodium]     Hot flashes   Zocor [Simvastatin] Other (See Comments)    fatigue    Current Outpatient Medications on File Prior to Visit  Medication Sig Dispense Refill   Acetaminophen 325 MG CAPS      acyclovir ointment (ZOVIRAX) 5 % Apply topically to affected area 5 (five) times daily for 4 days. 15 g 0   amphetamine-dextroamphetamine (ADDERALL XR) 15 MG 24 hr capsule Take 1 capsule by mouth in the morning as needed for ADD 30 capsule 0   amphetamine-dextroamphetamine (ADDERALL XR) 15 MG 24 hr capsule Take 1 capsule by mouth daily in the morning as needed for ADD 30 capsule 0   amphetamine-dextroamphetamine (ADDERALL) 15 MG tablet Take 1 tablet by mouth 2 (two) times daily. Once in the morning and once atnoon as needed for ADD. 60 tablet 0   busPIRone (BUSPAR) 5 MG tablet Take 1 tablet (5 mg total) by mouth 3 (three) times daily  for anxiety 90 tablet 2   Docusate Sodium (COLACE PO) Take by mouth.     esomeprazole (NEXIUM)  20 MG capsule Take 20 mg by mouth daily at 12 noon.     hydrochlorothiazide (HYDRODIURIL) 12.5 MG tablet TAKE  1 TABLET (12.5 MG TOTAL) BY MOUTH DAILY. FOR BLOOD PRESSURE. 90 tablet 3   Ibuprofen 200 MG CAPS      levonorgestrel (MIRENA) 20 MCG/24HR IUD 1 each by Intrauterine route once. Inserted 03/2016     losartan (COZAAR) 50 MG tablet Take 1 tablet (50 mg total) by mouth daily. For blood pressure. 90 tablet 3   tirzepatide (MOUNJARO) 5 MG/0.5ML Pen Inject 5 mg into the skin once a week. 6 mL 0   No current facility-administered medications on file prior to visit.    BP 126/72   Pulse 81   Temp 98.6 F (37 C) (Oral)   Ht '5\' 3"'$  (1.6 m)   Wt 187 lb (84.8 kg)   SpO2 95%   BMI 33.13 kg/m  Objective:   Physical Exam Cardiovascular:     Rate and Rhythm: Normal rate.  Pulmonary:     Effort: Pulmonary effort is normal.     Breath sounds: Normal breath sounds.  Musculoskeletal:     Cervical back: Neck supple.  Skin:    General: Skin is warm and dry.  Neurological:     Mental Status: She is oriented to person, place, and time.          Assessment & Plan:

## 2021-07-29 NOTE — Assessment & Plan Note (Signed)
Improved!!  Commended her on this success! No allergic reactions!  Increase Mounjaro to 7.5 mg weekly. New Rx sent to pharmacy. Follow up in 3 months.

## 2021-07-29 NOTE — Patient Instructions (Addendum)
We increased your dose of Mounjaro to 7.5 mg weekly.  Keep up the great work!  It was a pleasure to see you today!

## 2021-07-30 ENCOUNTER — Ambulatory Visit: Payer: 59 | Admitting: Primary Care

## 2021-08-03 NOTE — Telephone Encounter (Signed)
Prior auth for [employer] 7.'5MG'$ /0.5ML pen-injectors has been denied. Colleen Boone (Key: [id number])  Rx #: [id number]  Our guideline named GLP-1 STEP OVERRIDE (reviewed for Mounjaro pens) requires that  you have a diagnosis of type 2 diabetes mellitus (a chronic condition in which your body is  resistant to insulin, a hormone that regulates your blood sugar).  Your doctor requested this medication for weight loss or weight management. This request  was denied because your condition does not meet the requirements for approval.

## 2021-08-03 NOTE — Telephone Encounter (Signed)
Will they approve the PA for a diagnosis code of prediabetes?  She has been managed on Mounjaro for months without an issue.

## 2021-08-04 NOTE — Telephone Encounter (Signed)
Please contact patient.  Does she know that her Mounjaro 7.5 mg dose was denied by the insurance company?  Her insurance company has approved Mounjaro for months.  She may want to contact her insurance to find out what may be going on.

## 2021-08-05 NOTE — Telephone Encounter (Signed)
Called patient was told in past that insurance would not cover and needed to try something that was more for managing. She did say that there are people at her office have been getting approved by Dr. Juleen China. I have sent message to her to find out how she is getting approved. Patient did mention that if she needs to be seen by her she is willing to have referral to [facility] to see Dr. Juleen China.

## 2021-08-05 NOTE — Telephone Encounter (Signed)
Noted, will await the update.

## 2021-08-05 NOTE — Telephone Encounter (Signed)
Left message to return call to our office.  

## 2021-08-06 NOTE — Telephone Encounter (Signed)
Patient is calling in seeing if Joellen had any luck with getting through for the PA.

## 2021-08-09 NOTE — Telephone Encounter (Signed)
Called patient to verify recommendations on insurance questions from Dr. Juleen China and all are ok. Let patient know I will call insurance and see what needs to be done

## 2021-08-09 NOTE — Telephone Encounter (Signed)
Called all numbers on insurance card will not let me talk to person think there is something wrong with phone system will call back later.

## 2021-08-10 ENCOUNTER — Other Ambulatory Visit (HOSPITAL_COMMUNITY): Payer: Self-pay

## 2021-08-11 ENCOUNTER — Other Ambulatory Visit (HOSPITAL_COMMUNITY): Payer: Self-pay

## 2021-08-12 NOTE — Telephone Encounter (Signed)
Called number provided again yesterday while in the lab x 2 both times was on line for over 10 minutes. Transferred multiple times until somehow disconnected.

## 2021-08-17 NOTE — Telephone Encounter (Signed)
Patient states that she has told by the insurance company to complete the PA online. Please call patient to update.

## 2021-08-18 NOTE — Telephone Encounter (Signed)
New prior auth started for [facility] 7.'5MG'$ /0.5ML pen-injectors. Colleen Boone KeyMarch Rummage - PA Case ID: [id number] Waiting for determination.

## 2021-08-19 NOTE — Telephone Encounter (Signed)
Prior auth for [employer] 7.5MG /0.5ML pen-injectors has been denied.  Prior authorization requests requiring clinical review are completed by a licensed pharmacist  or physician clinical reviewer. Based upon the information we received from you/your  healthcare practitioner, we are unable to authorize MOUNJARO 7.5 MG/0.5 ML PEN at this  time. The reason(s) for this determination is (are):  Based on the information submitted for review, you did not meet our guideline rules for the  requested drug. In order for your request to be approved, your provider would need to show  that you have met the guideline rules below. The details below are written in medical  language. If you have questions, please contact your provider. In some cases, the requested  medication or alternatives offered may have additional approval requirements. Our guideline named GLP-1 STEP OVERRIDE (reviewed for [facility]) requires that you  have a diagnosis of type 2 diabetes mellitus (a chronic condition in which your body is  resistant to insulin, a hormone that regulates your blood sugar).   Your doctor requested this medication for weight loss or weight management and  prediabetes (a health condition that means your blood sugar level is higher than normal, but  not yet high enough for you to be diagnosed with diabetes).   This request was denied because your conditions do not meet the requirements for approval. Additionally, for patients who have the condition listed above, approval requires you to try  metformin, a metformin combination product, a covered sulfonylurea (such as glimepiride,  glipizide, or glyburide), a sulfonylurea combination product, pioglitazone OR a pioglitazone  combination product.

## 2021-08-20 ENCOUNTER — Other Ambulatory Visit: Payer: Self-pay

## 2021-08-20 ENCOUNTER — Other Ambulatory Visit (HOSPITAL_COMMUNITY): Payer: Self-pay

## 2021-08-20 MED ORDER — WEGOVY 0.25 MG/0.5ML ~~LOC~~ SOAJ
0.2500 mg | SUBCUTANEOUS | 0 refills | Status: DC
Start: 2021-08-20 — End: 2022-03-25
  Filled 2021-08-20: qty 2, fill #0

## 2021-08-20 NOTE — Telephone Encounter (Signed)
Left message to return call to our office.  

## 2021-08-20 NOTE — Telephone Encounter (Signed)
Patient called back stating that [facility] isnt an option. Pharmacy does not have any, and that is all on back order.

## 2021-08-20 NOTE — Telephone Encounter (Signed)
I think her insurance plan must have updated their requirements, Colleen Boone is only approved for Type 2 DM, not prediabetes or weight loss so they are likely just enforcing this now.   I see that she briefly tried [place] earlier this year (and insurance covered this for wt loss) - she switched back to [facility] since [place] was not as effective, however she only tried the lowest dose and did not titrate Wegovy up to maximum dose which is where most of the weight loss benefit is seen. I would recommend going back to [facility] and titrating up monthly until max dose is reached.

## 2021-08-20 NOTE — Telephone Encounter (Signed)
Patient called back did not think the wegovy worked before but if covered willing to try and titrate at higher dose. I have called in as directed by Dr. Einar Pheasant and informed patient that when she is due for refill to let our office know if she wants to go up on dose.

## 2021-08-20 NOTE — Telephone Encounter (Signed)
Routing to MA to find out of pt would like to try wegovy again.   If so, ok to send in 0.25 mg weekly x 4 weeks

## 2021-08-20 NOTE — Telephone Encounter (Signed)
See phone note medication was denied

## 2021-08-20 NOTE — Telephone Encounter (Signed)
Routing to MA to f/u with patient.   It looks like she has been able to get lower doses covered? And was on ozempic.   If lower doses were covered does she want to go back down to 5 mg?   I will also route to pharmacy to help provide additional support.

## 2021-08-26 ENCOUNTER — Other Ambulatory Visit (HOSPITAL_COMMUNITY): Payer: Self-pay

## 2021-08-26 DIAGNOSIS — F411 Generalized anxiety disorder: Secondary | ICD-10-CM | POA: Diagnosis not present

## 2021-08-26 DIAGNOSIS — F900 Attention-deficit hyperactivity disorder, predominantly inattentive type: Secondary | ICD-10-CM | POA: Diagnosis not present

## 2021-08-26 DIAGNOSIS — F331 Major depressive disorder, recurrent, moderate: Secondary | ICD-10-CM | POA: Diagnosis not present

## 2021-08-26 MED ORDER — BUSPIRONE HCL 5 MG PO TABS
5.0000 mg | ORAL_TABLET | Freq: Three times a day (TID) | ORAL | 0 refills | Status: DC
  Filled 2021-08-26: qty 90, 30d supply, fill #0

## 2021-09-03 NOTE — Telephone Encounter (Signed)
We received a letter from [employer] regarding Level 1 Appeal Request:  [place] [employer]) received your request for a/an EXPEDITED Level 1 Appeal of an adverse benefit determination.  We are reviewing your Level 1 Appeal regarding MOUNJARO 7.5 MG/0.5 ML PEN , which was prescribed by Alma Friendly.  [employer] will notify you by mail of the determination no later than 72 hours from receipt of your appeal.  Sent letter to scanning.

## 2021-09-27 ENCOUNTER — Other Ambulatory Visit (HOSPITAL_COMMUNITY): Payer: Self-pay

## 2021-09-27 ENCOUNTER — Other Ambulatory Visit: Payer: Self-pay | Admitting: Primary Care

## 2021-09-27 DIAGNOSIS — I10 Essential (primary) hypertension: Secondary | ICD-10-CM

## 2021-09-27 MED ORDER — HYDROCHLOROTHIAZIDE 12.5 MG PO TABS
12.5000 mg | ORAL_TABLET | Freq: Every day | ORAL | 0 refills | Status: DC
  Filled 2021-09-27: qty 90, 90d supply, fill #0

## 2021-09-28 ENCOUNTER — Other Ambulatory Visit (HOSPITAL_COMMUNITY): Payer: Self-pay

## 2021-09-29 ENCOUNTER — Other Ambulatory Visit (HOSPITAL_COMMUNITY): Payer: Self-pay

## 2021-09-29 MED ORDER — DOXYCYCLINE HYCLATE 100 MG PO TABS
100.0000 mg | ORAL_TABLET | Freq: Two times a day (BID) | ORAL | 0 refills | Status: DC
  Filled 2021-09-29: qty 20, 10d supply, fill #0

## 2021-10-12 ENCOUNTER — Other Ambulatory Visit (HOSPITAL_COMMUNITY): Payer: Self-pay

## 2021-10-12 ENCOUNTER — Other Ambulatory Visit: Payer: Self-pay | Admitting: Primary Care

## 2021-10-12 DIAGNOSIS — I10 Essential (primary) hypertension: Secondary | ICD-10-CM

## 2021-10-13 ENCOUNTER — Other Ambulatory Visit (HOSPITAL_COMMUNITY): Payer: Self-pay

## 2021-10-13 MED ORDER — LOSARTAN POTASSIUM 50 MG PO TABS
50.0000 mg | ORAL_TABLET | Freq: Every day | ORAL | 1 refills | Status: DC
  Filled 2021-10-13: qty 90, 90d supply, fill #0
  Filled 2022-01-14: qty 90, 90d supply, fill #1

## 2021-10-15 ENCOUNTER — Ambulatory Visit: Payer: 59 | Admitting: Primary Care

## 2021-10-29 DIAGNOSIS — F331 Major depressive disorder, recurrent, moderate: Secondary | ICD-10-CM | POA: Diagnosis not present

## 2021-10-29 DIAGNOSIS — F900 Attention-deficit hyperactivity disorder, predominantly inattentive type: Secondary | ICD-10-CM | POA: Diagnosis not present

## 2021-10-29 DIAGNOSIS — F411 Generalized anxiety disorder: Secondary | ICD-10-CM | POA: Diagnosis not present

## 2021-11-09 ENCOUNTER — Other Ambulatory Visit (HOSPITAL_COMMUNITY): Payer: Self-pay

## 2021-11-10 ENCOUNTER — Other Ambulatory Visit (HOSPITAL_COMMUNITY): Payer: Self-pay

## 2021-11-10 MED ORDER — AMPHETAMINE-DEXTROAMPHETAMINE 15 MG PO TABS
15.0000 mg | ORAL_TABLET | Freq: Two times a day (BID) | ORAL | 0 refills | Status: DC
  Filled 2021-11-10: qty 60, 30d supply, fill #0

## 2021-11-18 DIAGNOSIS — F331 Major depressive disorder, recurrent, moderate: Secondary | ICD-10-CM | POA: Diagnosis not present

## 2021-11-18 DIAGNOSIS — F900 Attention-deficit hyperactivity disorder, predominantly inattentive type: Secondary | ICD-10-CM | POA: Diagnosis not present

## 2021-11-18 DIAGNOSIS — F411 Generalized anxiety disorder: Secondary | ICD-10-CM | POA: Diagnosis not present

## 2021-12-13 ENCOUNTER — Other Ambulatory Visit: Payer: Self-pay | Admitting: Primary Care

## 2021-12-13 ENCOUNTER — Other Ambulatory Visit (HOSPITAL_COMMUNITY): Payer: Self-pay

## 2021-12-13 DIAGNOSIS — I10 Essential (primary) hypertension: Secondary | ICD-10-CM

## 2021-12-13 MED ORDER — HYDROCHLOROTHIAZIDE 12.5 MG PO TABS
12.5000 mg | ORAL_TABLET | Freq: Every day | ORAL | 0 refills | Status: DC
  Filled 2021-12-13: qty 90, 90d supply, fill #0

## 2021-12-13 NOTE — Telephone Encounter (Signed)
Patient is due for CPE/follow up in December, this will be required prior to any further refills.  Please schedule.

## 2021-12-14 NOTE — Telephone Encounter (Signed)
LVM for patient to call back and schedule

## 2022-01-14 ENCOUNTER — Other Ambulatory Visit (HOSPITAL_COMMUNITY): Payer: Self-pay

## 2022-02-14 ENCOUNTER — Other Ambulatory Visit (HOSPITAL_COMMUNITY): Payer: Self-pay

## 2022-03-17 ENCOUNTER — Other Ambulatory Visit: Payer: Self-pay | Admitting: Primary Care

## 2022-03-17 DIAGNOSIS — I10 Essential (primary) hypertension: Secondary | ICD-10-CM

## 2022-03-18 ENCOUNTER — Other Ambulatory Visit (HOSPITAL_COMMUNITY): Payer: Self-pay

## 2022-03-18 ENCOUNTER — Ambulatory Visit: Payer: Commercial Managed Care - PPO | Admitting: Primary Care

## 2022-03-18 MED ORDER — HYDROCHLOROTHIAZIDE 12.5 MG PO TABS
12.5000 mg | ORAL_TABLET | Freq: Every day | ORAL | 0 refills | Status: DC
  Filled 2022-03-18: qty 30, 30d supply, fill #0

## 2022-03-18 NOTE — Telephone Encounter (Signed)
Patient missed her annual follow-up visit with me today, can we get her rescheduled?

## 2022-03-18 NOTE — Telephone Encounter (Signed)
LVM for patient to call back and reschedule.

## 2022-03-21 ENCOUNTER — Other Ambulatory Visit (HOSPITAL_COMMUNITY): Payer: Self-pay

## 2022-03-21 NOTE — Telephone Encounter (Signed)
LVM for patient to call back and schedule

## 2022-03-22 ENCOUNTER — Other Ambulatory Visit (HOSPITAL_COMMUNITY): Payer: Self-pay

## 2022-03-22 MED ORDER — AMPHETAMINE-DEXTROAMPHETAMINE 15 MG PO TABS
15.0000 mg | ORAL_TABLET | Freq: Two times a day (BID) | ORAL | 0 refills | Status: DC
  Filled 2022-03-22: qty 60, 30d supply, fill #0

## 2022-03-22 MED ORDER — BUSPIRONE HCL 5 MG PO TABS
5.0000 mg | ORAL_TABLET | Freq: Three times a day (TID) | ORAL | 0 refills | Status: DC
  Filled 2022-03-22: qty 90, 30d supply, fill #0

## 2022-03-23 ENCOUNTER — Other Ambulatory Visit (HOSPITAL_COMMUNITY): Payer: Self-pay

## 2022-03-25 ENCOUNTER — Other Ambulatory Visit (HOSPITAL_COMMUNITY)
Admission: RE | Admit: 2022-03-25 | Discharge: 2022-03-25 | Disposition: A | Discharge status: Home / Self Care | Payer: Commercial Managed Care - PPO | Source: Ambulatory Visit | Attending: Family Medicine | Admitting: Family Medicine

## 2022-03-25 ENCOUNTER — Encounter: Payer: Self-pay | Admitting: Family Medicine

## 2022-03-25 ENCOUNTER — Ambulatory Visit (INDEPENDENT_AMBULATORY_CARE_PROVIDER_SITE_OTHER): Payer: Commercial Managed Care - PPO | Admitting: Family Medicine

## 2022-03-25 VITALS — BP 122/88 | HR 75 | Ht 63.0 in | Wt 182.0 lb

## 2022-03-25 DIAGNOSIS — Z01419 Encounter for gynecological examination (general) (routine) without abnormal findings: Secondary | ICD-10-CM

## 2022-03-25 DIAGNOSIS — R87610 Atypical squamous cells of undetermined significance on cytologic smear of cervix (ASC-US): Secondary | ICD-10-CM

## 2022-03-25 DIAGNOSIS — N921 Excessive and frequent menstruation with irregular cycle: Secondary | ICD-10-CM

## 2022-03-25 DIAGNOSIS — R87810 Cervical high risk human papillomavirus (HPV) DNA test positive: Secondary | ICD-10-CM | POA: Insufficient documentation

## 2022-03-25 DIAGNOSIS — R232 Flushing: Secondary | ICD-10-CM

## 2022-03-25 DIAGNOSIS — Z113 Encounter for screening for infections with a predominantly sexual mode of transmission: Secondary | ICD-10-CM

## 2022-03-25 DIAGNOSIS — Z975 Presence of (intrauterine) contraceptive device: Secondary | ICD-10-CM | POA: Diagnosis not present

## 2022-03-25 NOTE — Progress Notes (Signed)
   GYNECOLOGY ANNUAL PREVENTATIVE CARE ENCOUNTER NOTE  Subjective:   Colleen Boone is a 52 y.o. G69P1011 female here for a routine annual gynecologic exam.  Current complaints: has some spotting with IUD  She has a stable female partner x1 year.  No concenrs. Has PCP thatdoes yearly labs. Desires STI testing. Works in the [employer] .     Denies abnormal vaginal bleeding, discharge, pelvic pain, problems with intercourse or other gynecologic concerns.    Gynecologic History No LMP recorded. (Menstrual status: IUD). Contraception: IUD Last Pap: 2022. Results were: abnormal- ASCUS HPV POS, 16/18 neg Last mammogram: 2023. Results were: normal  Health Maintenance Due  Topic Date Due   INFLUENZA VACCINE  10/05/2021   DTaP/Tdap/Td (2 - Td or Tdap) 01/08/2022    The following portions of the patient's history were reviewed and updated as appropriate: allergies, current medications, past family history, past medical history, past social history, past surgical history and problem list.  Review of Systems Pertinent items are noted in HPI.   Objective:  BP 122/88   Pulse 75   Ht '5\' 3"'$  (1.6 m)   Wt 182 lb (82.6 kg)   BMI 32.24 kg/m  CONSTITUTIONAL: Well-developed, well-nourished female in no acute distress.  HENT:  Normocephalic, atraumatic, External right and left ear normal. Oropharynx is clear and moist EYES:  No scleral icterus.  NECK: Normal range of motion, supple, no masses.  Normal thyroid.  SKIN: Skin is warm and dry. No rash noted. Not diaphoretic. No erythema. No pallor. NEUROLOGIC: Alert and oriented to person, place, and time. Normal reflexes, muscle tone coordination. No cranial nerve deficit noted. PSYCHIATRIC: Normal mood and affect. Normal behavior. Normal judgment and thought content. CARDIOVASCULAR: Normal heart rate noted, regular rhythm. 2+ distal pulses. RESPIRATORY: Effort and breath sounds normal, no problems with respiration noted. BREASTS: Symmetric in size. No  masses, skin changes, nipple drainage, or lymphadenopathy. ABDOMEN: Soft,  no distention noted.  No tenderness, rebound or guarding.  PELVIC: Normal appearing external genitalia; normal appearing vaginal mucosa and cervix.  No abnormal discharge noted.  Pap smear obtained.  Normal uterine size, no other palpable masses, no uterine or adnexal tenderness. Chaperone present for exam MUSCULOSKELETAL: Normal range of motion.    Assessment and Plan:  1) Annual gynecologic examination with pap smear:  Will follow up results of pap smear and manage accordingly. STI screening desired Yes.  Routine preventative health maintenance measures emphasized. Reviewed perimenopausal symptoms and management.   2) Contraception counseling: IUD in place  1. ASCUS with positive high risk HPV cervical - Cytology - PAP - MM 3D SCREEN BREAST BILATERAL; Future - RPR+HBsAg+HCVAb+...  2. Breakthrough bleeding with IUD - Follicle stimulating hormone  3. Well woman exam with routine gynecological exam - Semaglutide, 1 MG/DOSE, (OZEMPIC, 1 MG/DOSE,) 2 MG/1.5ML SOPN; Inject 2 mg into the skin once a week.  4. Screening examination for venereal disease  5. Hot flashes - Follicle stimulating hormone   Please refer to After Visit Summary for other counseling recommendations.   Return in about 1 year (around 03/26/2023) for Yearly wellness exam.  Caren Macadam, MD, MPH, ABFM Attending Physician Center for [facility]

## 2022-03-28 LAB — RPR+HBSAG+HCVAB+...
HIV Screen 4th Generation wRfx: NONREACTIVE
Hep C Virus Ab: NONREACTIVE
Hepatitis B Surface Ag: NEGATIVE
RPR Ser Ql: NONREACTIVE

## 2022-03-28 LAB — FOLLICLE STIMULATING HORMONE: FSH: 107 m[IU]/mL

## 2022-03-29 ENCOUNTER — Encounter: Payer: Self-pay | Admitting: Family Medicine

## 2022-04-01 LAB — CYTOLOGY - PAP
Comment: NEGATIVE
Diagnosis: UNDETERMINED — AB
High risk HPV: NEGATIVE

## 2022-04-05 ENCOUNTER — Ambulatory Visit: Payer: Commercial Managed Care - PPO

## 2022-04-06 DIAGNOSIS — Z136 Encounter for screening for cardiovascular disorders: Secondary | ICD-10-CM | POA: Diagnosis not present

## 2022-04-07 ENCOUNTER — Other Ambulatory Visit (HOSPITAL_BASED_OUTPATIENT_CLINIC_OR_DEPARTMENT_OTHER): Payer: Self-pay

## 2022-04-07 ENCOUNTER — Encounter (HOSPITAL_BASED_OUTPATIENT_CLINIC_OR_DEPARTMENT_OTHER): Payer: Self-pay | Admitting: Pharmacist

## 2022-04-07 MED ORDER — WEGOVY 2.4 MG/0.75ML ~~LOC~~ SOAJ
SUBCUTANEOUS | 4 refills | Status: DC
  Filled 2022-04-07: qty 3, 28d supply, fill #0
  Filled 2022-05-14 (×2): qty 3, 28d supply, fill #1
  Filled 2022-06-14: qty 3, 28d supply, fill #2

## 2022-04-07 MED ORDER — HYDROCHLOROTHIAZIDE 12.5 MG PO TABS
12.5000 mg | ORAL_TABLET | Freq: Every day | ORAL | 0 refills | Status: AC
  Filled 2022-04-07 – 2022-04-25 (×2): qty 90, 90d supply, fill #0

## 2022-04-07 MED ORDER — LOSARTAN POTASSIUM 50 MG PO TABS
50.0000 mg | ORAL_TABLET | Freq: Every day | ORAL | 0 refills | Status: AC
  Filled 2022-04-07: qty 90, 90d supply, fill #0

## 2022-04-08 ENCOUNTER — Other Ambulatory Visit (HOSPITAL_BASED_OUTPATIENT_CLINIC_OR_DEPARTMENT_OTHER): Payer: Self-pay

## 2022-04-08 ENCOUNTER — Other Ambulatory Visit (HOSPITAL_COMMUNITY): Payer: Self-pay

## 2022-04-08 MED ORDER — ERGOCALCIFEROL 1.25 MG (50000 UT) PO CAPS
50000.0000 [IU] | ORAL_CAPSULE | ORAL | 0 refills | Status: AC
  Filled 2022-04-08: qty 12, 84d supply, fill #0

## 2022-04-08 MED ORDER — REPATHA SURECLICK 140 MG/ML ~~LOC~~ SOAJ
1.0000 mL | SUBCUTANEOUS | 0 refills | Status: DC
  Filled 2022-04-08: qty 2, 60d supply, fill #0
  Filled 2022-04-11: qty 2, 28d supply, fill #0

## 2022-04-11 ENCOUNTER — Other Ambulatory Visit (HOSPITAL_BASED_OUTPATIENT_CLINIC_OR_DEPARTMENT_OTHER): Payer: Self-pay

## 2022-04-12 ENCOUNTER — Other Ambulatory Visit (HOSPITAL_BASED_OUTPATIENT_CLINIC_OR_DEPARTMENT_OTHER): Payer: Self-pay

## 2022-04-12 ENCOUNTER — Ambulatory Visit (HOSPITAL_BASED_OUTPATIENT_CLINIC_OR_DEPARTMENT_OTHER)
Admission: RE | Admit: 2022-04-12 | Discharge: 2022-04-12 | Disposition: A | Discharge status: Home / Self Care | Payer: Commercial Managed Care - PPO | Source: Ambulatory Visit | Attending: Family Medicine | Admitting: Family Medicine

## 2022-04-12 DIAGNOSIS — Z1231 Encounter for screening mammogram for malignant neoplasm of breast: Secondary | ICD-10-CM | POA: Diagnosis not present

## 2022-04-12 DIAGNOSIS — R87610 Atypical squamous cells of undetermined significance on cytologic smear of cervix (ASC-US): Secondary | ICD-10-CM | POA: Insufficient documentation

## 2022-04-12 DIAGNOSIS — R87810 Cervical high risk human papillomavirus (HPV) DNA test positive: Secondary | ICD-10-CM | POA: Diagnosis not present

## 2022-04-13 ENCOUNTER — Other Ambulatory Visit (HOSPITAL_BASED_OUTPATIENT_CLINIC_OR_DEPARTMENT_OTHER): Payer: Self-pay

## 2022-04-15 ENCOUNTER — Other Ambulatory Visit (HOSPITAL_BASED_OUTPATIENT_CLINIC_OR_DEPARTMENT_OTHER): Payer: Self-pay

## 2022-04-17 ENCOUNTER — Other Ambulatory Visit (HOSPITAL_BASED_OUTPATIENT_CLINIC_OR_DEPARTMENT_OTHER): Payer: Self-pay

## 2022-04-18 ENCOUNTER — Encounter (HOSPITAL_BASED_OUTPATIENT_CLINIC_OR_DEPARTMENT_OTHER): Payer: Self-pay

## 2022-04-18 ENCOUNTER — Other Ambulatory Visit (HOSPITAL_BASED_OUTPATIENT_CLINIC_OR_DEPARTMENT_OTHER): Payer: Self-pay

## 2022-04-19 ENCOUNTER — Other Ambulatory Visit (HOSPITAL_BASED_OUTPATIENT_CLINIC_OR_DEPARTMENT_OTHER): Payer: Self-pay

## 2022-04-19 DIAGNOSIS — F900 Attention-deficit hyperactivity disorder, predominantly inattentive type: Secondary | ICD-10-CM | POA: Diagnosis not present

## 2022-04-19 DIAGNOSIS — F411 Generalized anxiety disorder: Secondary | ICD-10-CM | POA: Diagnosis not present

## 2022-04-19 DIAGNOSIS — F331 Major depressive disorder, recurrent, moderate: Secondary | ICD-10-CM | POA: Diagnosis not present

## 2022-04-19 MED ORDER — AMPHETAMINE-DEXTROAMPHETAMINE 15 MG PO TABS
15.0000 mg | ORAL_TABLET | Freq: Two times a day (BID) | ORAL | 0 refills | Status: AC | PRN
  Filled 2022-04-20: qty 60, 30d supply, fill #0

## 2022-04-20 ENCOUNTER — Other Ambulatory Visit: Payer: Self-pay

## 2022-04-20 ENCOUNTER — Other Ambulatory Visit (HOSPITAL_BASED_OUTPATIENT_CLINIC_OR_DEPARTMENT_OTHER): Payer: Self-pay

## 2022-04-21 ENCOUNTER — Other Ambulatory Visit (HOSPITAL_BASED_OUTPATIENT_CLINIC_OR_DEPARTMENT_OTHER): Payer: Self-pay

## 2022-04-22 ENCOUNTER — Encounter (HOSPITAL_BASED_OUTPATIENT_CLINIC_OR_DEPARTMENT_OTHER): Payer: Self-pay

## 2022-04-22 ENCOUNTER — Other Ambulatory Visit (HOSPITAL_BASED_OUTPATIENT_CLINIC_OR_DEPARTMENT_OTHER): Payer: Self-pay

## 2022-04-25 ENCOUNTER — Other Ambulatory Visit (HOSPITAL_BASED_OUTPATIENT_CLINIC_OR_DEPARTMENT_OTHER): Payer: Self-pay

## 2022-05-14 ENCOUNTER — Other Ambulatory Visit (HOSPITAL_BASED_OUTPATIENT_CLINIC_OR_DEPARTMENT_OTHER): Payer: Self-pay

## 2022-05-27 ENCOUNTER — Other Ambulatory Visit (HOSPITAL_BASED_OUTPATIENT_CLINIC_OR_DEPARTMENT_OTHER): Payer: Self-pay

## 2022-05-29 ENCOUNTER — Other Ambulatory Visit (HOSPITAL_BASED_OUTPATIENT_CLINIC_OR_DEPARTMENT_OTHER): Payer: Self-pay

## 2022-05-29 MED ORDER — REPATHA SURECLICK 140 MG/ML ~~LOC~~ SOAJ
SUBCUTANEOUS | 0 refills | Status: DC
  Filled 2022-05-29 – 2022-06-06 (×2): qty 2, 28d supply, fill #0

## 2022-06-06 ENCOUNTER — Other Ambulatory Visit (HOSPITAL_BASED_OUTPATIENT_CLINIC_OR_DEPARTMENT_OTHER): Payer: Self-pay

## 2022-06-07 ENCOUNTER — Other Ambulatory Visit (HOSPITAL_BASED_OUTPATIENT_CLINIC_OR_DEPARTMENT_OTHER): Payer: Self-pay

## 2022-06-07 DIAGNOSIS — F411 Generalized anxiety disorder: Secondary | ICD-10-CM | POA: Diagnosis not present

## 2022-06-07 DIAGNOSIS — F331 Major depressive disorder, recurrent, moderate: Secondary | ICD-10-CM | POA: Diagnosis not present

## 2022-06-07 DIAGNOSIS — F900 Attention-deficit hyperactivity disorder, predominantly inattentive type: Secondary | ICD-10-CM | POA: Diagnosis not present

## 2022-06-07 MED ORDER — BUSPIRONE HCL 5 MG PO TABS
5.0000 mg | ORAL_TABLET | Freq: Three times a day (TID) | ORAL | 0 refills | Status: AC
  Filled 2022-06-07: qty 55, 19d supply, fill #0
  Filled 2022-06-07: qty 35, 11d supply, fill #0

## 2022-06-07 MED ORDER — AMPHETAMINE-DEXTROAMPHETAMINE 15 MG PO TABS
15.0000 mg | ORAL_TABLET | Freq: Two times a day (BID) | ORAL | 0 refills | Status: DC
  Filled 2022-06-07: qty 60, 30d supply, fill #0

## 2022-06-09 ENCOUNTER — Other Ambulatory Visit (HOSPITAL_BASED_OUTPATIENT_CLINIC_OR_DEPARTMENT_OTHER): Payer: Self-pay

## 2022-06-20 ENCOUNTER — Encounter: Payer: Self-pay | Admitting: *Deleted

## 2022-06-30 ENCOUNTER — Other Ambulatory Visit: Payer: Self-pay

## 2022-07-09 ENCOUNTER — Other Ambulatory Visit (HOSPITAL_COMMUNITY): Payer: Self-pay

## 2022-08-11 ENCOUNTER — Other Ambulatory Visit (HOSPITAL_BASED_OUTPATIENT_CLINIC_OR_DEPARTMENT_OTHER): Payer: Self-pay

## 2022-08-11 MED ORDER — AMPHETAMINE-DEXTROAMPHETAMINE 15 MG PO TABS
ORAL_TABLET | ORAL | 0 refills | Status: AC
  Filled 2022-08-11: qty 60, 30d supply, fill #0

## 2022-10-12 ENCOUNTER — Other Ambulatory Visit (HOSPITAL_COMMUNITY): Payer: Self-pay

## 2023-01-09 ENCOUNTER — Other Ambulatory Visit (HOSPITAL_COMMUNITY): Payer: Self-pay

## 2023-04-19 ENCOUNTER — Ambulatory Visit: Payer: Commercial Managed Care - PPO | Admitting: Family Medicine

## 2023-05-31 ENCOUNTER — Ambulatory Visit: Payer: Self-pay | Admitting: Family Medicine

## 2023-05-31 ENCOUNTER — Encounter: Payer: Self-pay | Admitting: Family Medicine

## 2023-05-31 VITALS — BP 134/90 | HR 94 | Wt 200.0 lb

## 2023-05-31 DIAGNOSIS — E669 Obesity, unspecified: Secondary | ICD-10-CM | POA: Diagnosis not present

## 2023-05-31 DIAGNOSIS — E785 Hyperlipidemia, unspecified: Secondary | ICD-10-CM

## 2023-05-31 DIAGNOSIS — Z01419 Encounter for gynecological examination (general) (routine) without abnormal findings: Secondary | ICD-10-CM | POA: Diagnosis not present

## 2023-05-31 DIAGNOSIS — Z1231 Encounter for screening mammogram for malignant neoplasm of breast: Secondary | ICD-10-CM

## 2023-05-31 DIAGNOSIS — I10 Essential (primary) hypertension: Secondary | ICD-10-CM

## 2023-05-31 DIAGNOSIS — K760 Fatty (change of) liver, not elsewhere classified: Secondary | ICD-10-CM | POA: Diagnosis not present

## 2023-05-31 DIAGNOSIS — Z30432 Encounter for removal of intrauterine contraceptive device: Secondary | ICD-10-CM

## 2023-05-31 MED ORDER — ZEPBOUND 2.5 MG/0.5ML ~~LOC~~ SOAJ: 2.5000 mg | SUBCUTANEOUS | 3 refills | Status: DC

## 2023-05-31 NOTE — Progress Notes (Signed)
 Patient presents for Annual.  LMP: No LMP recorded. (Menstrual status: IUD).  Last pap: Date: 1.19.24-abnormal -Ascus- HPV- Contraception: IUD: Mirena Mammogram: Due, last mammogram: 2.6.24 STD Screening: not indicated Flu Vaccine : N/A  CC:  Wanting Mirena removed (placed 2018? )   Wanting to discuss monjaro ?

## 2023-05-31 NOTE — Assessment & Plan Note (Signed)
 60454 -  likely to improve with weight loss

## 2023-05-31 NOTE — Assessment & Plan Note (Signed)
 09811 - On meds. Likely to improve with weight loss.

## 2023-05-31 NOTE — Assessment & Plan Note (Signed)
 95621 - On Zetia, likely to improve with weight loss.

## 2023-05-31 NOTE — Assessment & Plan Note (Signed)
 Prescribed Zepbound, will have pt. Return for weight check in 1 month.

## 2023-05-31 NOTE — Progress Notes (Signed)
 Subjective:     Colleen Boone is a 53 y.o. female and is here for a comprehensive physical exam. The patient reports no problems. Needs her IUD removed. She is menopausal. She is interested in GLP-1 for weight loss. Prior good success with Zepbound. Tried online, generic and developed severe vomiting.   The following portions of the patient's history were reviewed and updated as appropriate: allergies, current medications, past family history, past medical history, past social history, past surgical history, and problem list.  Review of Systems Pertinent items noted in HPI and remainder of comprehensive ROS otherwise negative.   Objective:  Chaperone present for exam   BP (!) 134/90   Pulse 94   Wt 200 lb (90.7 kg)   BMI 35.43 kg/m  General appearance: alert, cooperative, and appears stated age Head: Normocephalic, without obvious abnormality, atraumatic Neck: no adenopathy, supple, symmetrical, trachea midline, and thyroid not enlarged, symmetric, no tenderness/mass/nodules Lungs: clear to auscultation bilaterally Breasts: normal appearance, no masses or tenderness Heart: regular rate and rhythm, S1, S2 normal, no murmur, click, rub or gallop Abdomen: soft, non-tender; bowel sounds normal; no masses,  no organomegaly Pelvic: cervix normal in appearance, external genitalia normal, no adnexal masses or tenderness, no cervical motion tenderness, uterus normal size, shape, and consistency, vagina normal without discharge, and IUD strings noted Extremities: extremities normal, atraumatic, no cyanosis or edema Pulses: 2+ and symmetric Skin: Skin color, texture, turgor normal. No rashes or lesions Lymph nodes: Cervical, supraclavicular, and axillary nodes normal. Neurologic: Grossly normal    Procedure: Speculum placed inside vagina.  Cervix visualized.  Strings grasped with ring forceps.  IUD removed intact.  Assessment:    Healthy female exam.      Plan:   Problem List Items  Addressed This Visit       Unprioritized   HTN (hypertension)   99214 - On meds. Likely to improve with weight loss.      Relevant Medications   ezetimibe (ZETIA) 10 MG tablet   PRALUENT 150 MG/ML SOAJ   tirzepatide (ZEPBOUND) 2.5 MG/0.5ML Pen   HLD (hyperlipidemia)   99214 - On Zetia, likely to improve with weight loss.      Relevant Medications   ezetimibe (ZETIA) 10 MG tablet   PRALUENT 150 MG/ML SOAJ   tirzepatide (ZEPBOUND) 2.5 MG/0.5ML Pen   Obesity (BMI 30-39.9)   Prescribed Zepbound, will have pt. Return for weight check in 1 month.      Relevant Medications   tirzepatide (ZEPBOUND) 2.5 MG/0.5ML Pen   NAFLD (nonalcoholic fatty liver disease)   99214 -  likely to improve with weight loss      Relevant Medications   tirzepatide (ZEPBOUND) 2.5 MG/0.5ML Pen   Other Visit Diagnoses       Encounter for gynecological examination without abnormal finding    -  Primary   has labs through PCP     Encounter for IUD removal       s/p removal today     Encounter for screening mammogram for malignant neoplasm of breast       Relevant Orders   MM 3D SCREENING MAMMOGRAM BILATERAL BREAST         See After Visit Summary for Counseling Recommendations

## 2023-06-07 ENCOUNTER — Encounter: Payer: Self-pay | Admitting: *Deleted

## 2023-06-13 ENCOUNTER — Encounter

## 2023-07-05 ENCOUNTER — Ambulatory Visit: Admitting: Family Medicine

## 2023-07-10 ENCOUNTER — Other Ambulatory Visit: Payer: Self-pay | Admitting: *Deleted

## 2023-07-10 ENCOUNTER — Encounter: Payer: Self-pay | Admitting: Family Medicine

## 2023-07-10 DIAGNOSIS — E669 Obesity, unspecified: Secondary | ICD-10-CM

## 2023-07-10 DIAGNOSIS — I10 Essential (primary) hypertension: Secondary | ICD-10-CM

## 2023-07-10 DIAGNOSIS — K760 Fatty (change of) liver, not elsewhere classified: Secondary | ICD-10-CM

## 2023-07-10 DIAGNOSIS — E785 Hyperlipidemia, unspecified: Secondary | ICD-10-CM

## 2023-07-10 MED ORDER — ZEPBOUND 2.5 MG/0.5ML ~~LOC~~ SOAJ: 2.5000 mg | SUBCUTANEOUS | 3 refills | Status: AC

## 2023-07-10 NOTE — Progress Notes (Signed)
Pt requested medication be sent to another pharmacy.

## 2023-07-18 ENCOUNTER — Ambulatory Visit
Admission: RE | Admit: 2023-07-18 | Discharge: 2023-07-18 | Disposition: A | Discharge status: Home / Self Care | Source: Ambulatory Visit | Attending: Family Medicine | Admitting: Family Medicine

## 2023-07-18 DIAGNOSIS — Z1231 Encounter for screening mammogram for malignant neoplasm of breast: Secondary | ICD-10-CM | POA: Diagnosis present

## 2023-07-24 ENCOUNTER — Ambulatory Visit: Payer: Self-pay | Admitting: Family Medicine

## 2023-08-16 ENCOUNTER — Ambulatory Visit: Attending: Physician Assistant

## 2023-08-16 ENCOUNTER — Encounter: Payer: Self-pay | Admitting: Physical Therapy

## 2023-08-16 DIAGNOSIS — M5459 Other low back pain: Secondary | ICD-10-CM | POA: Insufficient documentation

## 2023-08-16 NOTE — Therapy (Signed)
 PT/OT/SLP Screening Form   Time in: 2:04 p.m.     Time out: 2:30 p.m.   Complaint: LBP radiating into L hip Past Medical Hx: HTN, HLD Injury Date: December 2024 Pain Scale: 7/10 NPS  Hx (this occurrence):  Pt fell on her R side in December. Had lumbar X-ray with no signs of fractures. Was initially R sided and across lower LBP. This morning pain is now L sided, radiating to groin and quads. Worst pain: 7/10 NPS currently with dull ache and throbbing. Makes her nauseous.      Assessment:  MMT R/L: all 5/5 RLE. 4/5 MMT hip flexion, hip IR, and ankle dorsiflexion. B hip abduction and extension 4/5.  Sensation to LT intact bilaterally  Slump test negative bilaterally Femoral nerve tension test negative bilaterally FADDIR and FABER negative bilaterally  CPA joint springing negative L1-S1      Recommendations:    Comments: Signs/symptoms plausible for strain of hip musculature. Pain eased post screen with LE stretches. Provided stretching HEP as listed below. Encouraged f/u with PT referral if symptoms persist.    Access Code: [email or web address] URL: [email or web address] Date: 08/16/2023 Prepared by: Lyda Samples  Exercises - Standing Quadriceps Stretch  - 1 x daily - 7 x weekly - 1 sets - 3 reps - 30 hold - Supine Single Knee to Chest Stretch  - 1 x daily - 7 x weekly - 1 sets - 3 reps - 30 hold   []  Patient would benefit from an MD referral [x]  Patient would benefit from a full PT evaluation and treatment. []  No intervention recommended at this time.

## 2023-09-13 ENCOUNTER — Encounter: Payer: Self-pay | Admitting: Internal Medicine

## 2023-09-22 ENCOUNTER — Encounter: Payer: Self-pay | Admitting: Advanced Practice Midwife

## 2023-10-24 ENCOUNTER — Encounter

## 2023-11-07 ENCOUNTER — Encounter: Admitting: Internal Medicine

## 2023-12-11 ENCOUNTER — Encounter: Payer: Self-pay | Admitting: Internal Medicine

## 2023-12-11 ENCOUNTER — Ambulatory Visit (AMBULATORY_SURGERY_CENTER)

## 2023-12-11 VITALS — Ht 63.0 in | Wt 192.0 lb

## 2023-12-11 DIAGNOSIS — Z85038 Personal history of other malignant neoplasm of large intestine: Secondary | ICD-10-CM

## 2023-12-11 DIAGNOSIS — Z800 Family history of malignant neoplasm of digestive organs: Secondary | ICD-10-CM

## 2023-12-11 MED ORDER — NA SULFATE-K SULFATE-MG SULF 17.5-3.13-1.6 GM/177ML PO SOLN: 1.0000 | Freq: Once | ORAL | 0 refills | Status: AC

## 2023-12-11 NOTE — Progress Notes (Signed)
 No egg or soy allergy known to patient  No issues known to pt with past sedation with any surgeries or procedures Patient denies ever being told they had issues or difficulty with intubation  No FH of Malignant Hyperthermia Pt is not on diet pills - Zepbound  and hold instructions provided Pt is not on  home 02  Pt is not on blood thinners  Pt denies issues with constipation  No A fib or A flutter Have any cardiac testing pending--No Pt can ambulate  Pt denies use of chewing tobacco Discussed diabetic I weight loss medication holds Discussed NSAID holds Checked BMI Pt instructed to use Singlecare.com or GoodRx for a price reduction on prep  Patient's chart reviewed by Norleen Schillings CNRA prior to previsit and patient appropriate for the [facility].  Pre visit completed and red dot placed by patient's name on their procedure day (on provider's schedule).

## 2023-12-18 NOTE — Progress Notes (Unsigned)
 [facility] History and Physical   Primary Care Physician:  Emilio Joesph DEL, PA-C   Reason for Procedure:  Personal history of colon cancer and colon polyps  Plan:    Colonoscopy     HPI: Colleen Boone is a 52 y.o. female status post removal of a malignant rectosigmoid polyp in 2020.  She also had other adenomatous polyps removed.  Follow-up exam in 2022 was without polyps or cancer.  She is here for a surveillance examination.  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   Family history of bladder cancer    Family history of colon cancer    Fibroids 12/06/2011   Ultrasound showed: 8.8 x 4.6 x 5.6 cm uterus. 2.0 cm intramural fibroid in the uterine fundus, second intramural 1.7 cm fibroid in the right anterior upper corpus. EM 5 mm, no focal lesion. Normal ovaries.    GERD (gastroesophageal reflux disease)    Hay fever    Herpes labialis    acid foods and stress incite   History of gestational diabetes    HLD (hyperlipidemia)    HTN (hypertension)    Obesity    Uterine polyp    Vaginal cyst     Past Surgical History:  Procedure Laterality Date   DILATION AND CURETTAGE OF UTERUS  2007   TONSILLECTOMY  1975   TYMPANOSTOMY     WISDOM TOOTH EXTRACTION      x4     Current Outpatient Medications  Medication Sig Dispense Refill   acyclovir  ointment (ZOVIRAX ) 5 % Apply topically to affected area 5 (five) times daily for 4 days. 15 g 0   amphetamine -dextroamphetamine  (ADDERALL ) 15 MG tablet Take 1 tablet by mouth 2 (two) times daily in the morning and at noon as needed for ADD. 60 tablet 0   amphetamine -dextroamphetamine  (ADDERALL ) 15 MG tablet Take 1 tablet by mouth 2 (two) times daily, in the morning and at noon, as needed for Attention Deficit Disorder. 60 tablet 0    amphetamine -dextroamphetamine  (ADDERALL ) 15 MG tablet Take 1 tablet by mouth in the morning AND 1 tablet daily at 12 noon as needed for ADD 60 tablet 0   busPIRone  (BUSPAR ) 5 MG tablet Take 1 tablet (5 mg total) by mouth 3 (three) times daily  for anxiety (Patient taking differently: Take 5 mg by mouth 3 (three) times daily as needed. 5mg  q am) 90 tablet 2   busPIRone  (BUSPAR ) 5 MG tablet Take 1 tablet (5 mg total) by mouth 3 (three) times daily for anxiety. 90 tablet 0   Docusate Sodium (COLACE PO) Take by mouth. (Patient not taking: Reported on 12/11/2023)     ergocalciferol  (VITAMIN D2) 1.25 MG (50000 UT) capsule Take 1 capsule (50,000 Units total) by mouth once a week. (Patient not taking: Reported on 12/11/2023) 16 capsule 0   esomeprazole (NEXIUM) 20 MG capsule Take 20 mg by mouth daily at 12 noon.     ezetimibe (ZETIA) 10 MG tablet Take 10 mg by mouth daily.     hydrochlorothiazide  (HYDRODIURIL ) 12.5 MG tablet Take 1 tablet (12.5 mg total) by mouth daily. 90 tablet 0   Ibuprofen 200 MG CAPS      levonorgestrel  (MIRENA ) 20 MCG/24HR IUD 1 each by Intrauterine route once. Inserted 03/2016  losartan  (COZAAR ) 50 MG tablet Take 1 tablet (50 mg total) by mouth daily. 90 tablet 0   ondansetron  (ZOFRAN -ODT) 4 MG disintegrating tablet Take 4 mg by mouth every 8 (eight) hours as needed.     PRALUENT 150 MG/ML SOAJ Inject 150 mg into the skin.     REPATHA  140 MG/ML SOSY Inject 140 mg into the skin every 14 (fourteen) days.     tirzepatide  (ZEPBOUND ) 10 MG/0.5ML Pen Inject 10 mg into the skin once a week.     tirzepatide  (ZEPBOUND ) 10 MG/0.5ML Pen Inject 10 mg into the skin once a week.     tirzepatide  (ZEPBOUND ) 2.5 MG/0.5ML Pen Inject 2.5 mg into the skin once a week. (Patient not taking: Reported on 12/11/2023) 0.5 mL 3   valACYclovir  (VALTREX ) 500 MG tablet Take 500 mg by mouth daily.     No current facility-administered medications for this visit.    Allergies as of 12/19/2023 - Review Complete  12/11/2023  Allergen Reaction Noted   Latex Rash 12/16/2010   Aleve [naproxen sodium]  12/16/2010   Zocor  [simvastatin ] Other (See Comments) 10/03/2013    Family History  Problem Relation Age of Onset   Hypertension Mother    Diabetes Mother    Other Mother        NASH with varices   Liver disease Mother        NASH, ITP   Colon polyps Mother    Coronary artery disease Father 47       massive MI   Cancer Father 87       lymphoma   Hypertension Father    Hyperlipidemia Father    Diabetes Father    Other Sister        MVA   Colon polyps Brother    Diabetes Brother    Deep vein thrombosis Brother    Colon cancer Maternal Uncle 67   Bladder Cancer Maternal Uncle    Stroke Maternal Grandmother        post card cath   Cancer Maternal Grandmother        leukemia and polycythemia   Hypertension Maternal Grandmother    Hypertension Maternal Grandfather    Heart attack Maternal Grandfather    Stroke Paternal Grandmother    Hypertension Paternal Grandmother    Stroke Paternal Grandfather    Hypertension Paternal Grandfather    Heart attack Paternal Grandfather    Breast cancer Neg Hx    Esophageal cancer Neg Hx    Stomach cancer Neg Hx    Rectal cancer Neg Hx     Social History   Socioeconomic History   Marital status: Single    Spouse name: Not on file   Number of children: Not on file   Years of education: Not on file   Highest education level: Not on file  Occupational History   Not on file  Tobacco Use   Smoking status: Former   Smokeless tobacco: Never   Tobacco comments:    was social smoker only  Advertising account planner   Vaping status: Never Used  Substance and Sexual Activity   Alcohol use: Yes    Comment: 2 glasses red wine nightly   Drug use: No   Sexual activity: Yes    Partners: Male    Birth control/protection: I.U.D.  Other Topics Concern   Not on file  Social History Narrative   Caffeine: 1 cup coffee/day   Lives with son (1998), 2 dogs    Occupation: Charity fundraiser at  [employer], working on [employer]   Activity: gym 2-3x/wk, cardio, back in school   Diet: not lots of fruits.  Some water.   Social Drivers of Corporate investment banker Strain: Low Risk  (05/09/2023)   Received from [facility]   Overall Financial Resource Strain (CARDIA)    Difficulty of Paying Living Expenses: Not hard at all  Food Insecurity: No Food Insecurity (05/09/2023)   Received from [facility]   Hunger Vital Sign    Within the past 12 months, you worried that your food would run out before you got the money to buy more.: Never true    Within the past 12 months, the food you bought just didn't last and you didn't have money to get more.: Never true  Transportation Needs: No Transportation Needs (05/09/2023)   Received from [facility] - Transportation    Lack of Transportation (Medical): No    Lack of Transportation (Non-Medical): No  Physical Activity: Unknown (05/09/2023)   Received from [facility]   Exercise Vital Sign    On average, how many days per week do you engage in moderate to strenuous exercise (like a brisk walk)?: 0 days    Minutes of Exercise per Session: Not on file  Stress: No Stress Concern Present (05/09/2023)   Received from [facility] - Occupational Stress Questionnaire    Feeling of Stress : Only a little  Social Connections: Socially Integrated (05/09/2023)   [facility]   Social Network    How would you rate your social network (family, work, friends)?: Good participation with social networks  Intimate Partner Violence: Not At Risk (05/09/2023)   Received from [facility]   HITS    Over the last 12 months how often did your partner physically hurt you?: Never    Over the last 12 months how often did your partner insult you or talk down to you?: Never    Over the last 12 months how often did your partner threaten you with physical harm?: Never    Over the  last 12 months how often did your partner scream or curse at you?: Rarely    Review of Systems: Positive for *** All other review of systems negative except as mentioned in the HPI.  Physical Exam: Vital signs LMP 10/06/2019   General:   Alert,  Well-developed, well-nourished, pleasant and cooperative in NAD Lungs:  Clear throughout to auscultation.   Heart:  Regular rate and rhythm; no murmurs, clicks, rubs,  or gallops. Abdomen:  Soft, nontender and nondistended. Normal bowel sounds.   Neuro/Psych:  Alert and cooperative. Normal mood and affect. A and O x 3   @Colleen Boone  Colleen Commander, MD, [facility] [phone] (pager) 12/18/2023 5:46 PM@

## 2023-12-19 ENCOUNTER — Ambulatory Visit (AMBULATORY_SURGERY_CENTER): Admitting: Internal Medicine

## 2023-12-19 ENCOUNTER — Encounter: Payer: Self-pay | Admitting: Internal Medicine

## 2023-12-19 VITALS — BP 140/99 | HR 74 | Temp 97.5°F | Resp 14 | Ht 63.0 in | Wt 192.0 lb

## 2023-12-19 DIAGNOSIS — L818 Other specified disorders of pigmentation: Secondary | ICD-10-CM | POA: Diagnosis not present

## 2023-12-19 DIAGNOSIS — Z1211 Encounter for screening for malignant neoplasm of colon: Secondary | ICD-10-CM | POA: Diagnosis present

## 2023-12-19 DIAGNOSIS — Z860101 Personal history of adenomatous and serrated colon polyps: Secondary | ICD-10-CM

## 2023-12-19 DIAGNOSIS — D122 Benign neoplasm of ascending colon: Secondary | ICD-10-CM | POA: Diagnosis not present

## 2023-12-19 DIAGNOSIS — Z87891 Personal history of nicotine dependence: Secondary | ICD-10-CM | POA: Diagnosis not present

## 2023-12-19 DIAGNOSIS — Z85038 Personal history of other malignant neoplasm of large intestine: Secondary | ICD-10-CM | POA: Diagnosis not present

## 2023-12-19 DIAGNOSIS — D124 Benign neoplasm of descending colon: Secondary | ICD-10-CM | POA: Diagnosis not present

## 2023-12-19 DIAGNOSIS — Z800 Family history of malignant neoplasm of digestive organs: Secondary | ICD-10-CM

## 2023-12-19 DIAGNOSIS — K5730 Diverticulosis of large intestine without perforation or abscess without bleeding: Secondary | ICD-10-CM

## 2023-12-19 MED ORDER — SODIUM CHLORIDE 0.9 % IV SOLN: 500.0000 mL | Freq: Once | INTRAVENOUS | Status: DC

## 2023-12-19 NOTE — Progress Notes (Signed)
 Sedate, gd SR, tolerated procedure well, VSS, report to RN

## 2023-12-19 NOTE — Progress Notes (Signed)
Pt. states no medical or surgical changes since previsit or office visit. 

## 2023-12-19 NOTE — Patient Instructions (Addendum)
 Discharge instructions given. Handouts on polyps,and Diverticulosis. Resume previous medications.YOU HAD AN ENDOSCOPIC PROCEDURE TODAY AT THE [facility]:   Refer to the procedure report that was given to you for any specific questions about what was found during the examination.  If the procedure report does not answer your questions, please call your gastroenterologist to clarify.  If you requested that your care partner not be given the details of your procedure findings, then the procedure report has been included in a sealed envelope for you to review at your convenience later.  YOU SHOULD EXPECT: Some feelings of bloating in the abdomen. Passage of more gas than usual.  Walking can help get rid of the air that was put into your GI tract during the procedure and reduce the bloating. If you had a lower endoscopy (such as a colonoscopy or flexible sigmoidoscopy) you may notice spotting of blood in your stool or on the toilet paper. If you underwent a bowel prep for your procedure, you may not have a normal bowel movement for a few days.  Please Note:  You might notice some irritation and congestion in your nose or some drainage.  This is from the oxygen used during your procedure.  There is no need for concern and it should clear up in a day or so.  SYMPTOMS TO REPORT IMMEDIATELY:  Following lower endoscopy (colonoscopy or flexible sigmoidoscopy):  Excessive amounts of blood in the stool  Significant tenderness or worsening of abdominal pains  Swelling of the abdomen that is new, acute  Fever of 100F or higher   For urgent or emergent issues, a gastroenterologist can be reached at any hour by calling [phone]. Do not use MyChart messaging for urgent concerns.    DIET:  We do recommend a small meal at first, but then you may proceed to your regular diet.  Drink plenty of fluids but you should avoid alcoholic beverages for 24 hours.  ACTIVITY:  You should plan to take it  easy for the rest of today and you should NOT DRIVE or use heavy machinery until tomorrow (because of the sedation medicines used during the test).    FOLLOW UP: Our staff will call the number listed on your records the next business day following your procedure.  We will call around 7:15- 8:00 am to check on you and address any questions or concerns that you may have regarding the information given to you following your procedure. If we do not reach you, we will leave a message.     If any biopsies were taken you will be contacted by phone or by letter within the next 1-3 weeks.  Please call us  at [phone] if you have not heard about the biopsies in 3 weeks.    SIGNATURES/CONFIDENTIALITY: You and/or your care partner have signed paperwork which will be entered into your electronic medical record.  These signatures attest to the fact that that the information above on your After Visit Summary has been reviewed and is understood.  Full responsibility of the confidentiality of this discharge information lies with you and/or your care-partner.

## 2023-12-19 NOTE — Op Note (Signed)
 [facility] Patient Name: Colleen Boone Procedure Date: 12/19/2023 11:33 AM MRN: [id number] Endoscopist: Lupita FORBES Commander , MD, [id number] Age: 53 Referring MD:  Date of Birth: [birth year] Gender: Female Account #: [id number] Procedure:                Colonoscopy Indications:              High risk colon cancer surveillance: Personal                            history of colon cancer, Last colonoscopy: 2022 Medicines:                Monitored Anesthesia Care Procedure:                Pre-Anesthesia Assessment:                           - Prior to the procedure, a History and Physical                            was performed, and patient medications and                            allergies were reviewed. The patient's tolerance of                            previous anesthesia was also reviewed. The risks                            and benefits of the procedure and the sedation                            options and risks were discussed with the patient.                            All questions were answered, and informed consent                            was obtained. Prior Anticoagulants: The patient has                            taken no anticoagulant or antiplatelet agents. ASA                            Grade Assessment: III - A patient with severe                            systemic disease. After reviewing the risks and                            benefits, the patient was deemed in satisfactory                            condition to undergo the procedure.  After obtaining informed consent, the colonoscope                            was passed under direct vision. Throughout the                            procedure, the patient's blood pressure, pulse, and                            oxygen saturations were monitored continuously. The                            Olympus Scope [id number] was introduced through the                            anus  and advanced to the the cecum, identified by                            appendiceal orifice and ileocecal valve. The                            colonoscopy was performed without difficulty. The                            patient tolerated the procedure well. The quality                            of the bowel preparation was good. The ileocecal                            valve, appendiceal orifice, and rectum were                            photographed. The bowel preparation used was SUPREP                            via split dose instruction. Scope In: 11:44:50 AM Scope Out: 11:57:28 AM Scope Withdrawal Time: 0 hours 8 minutes 58 seconds  Total Procedure Duration: 0 hours 12 minutes 38 seconds  Findings:                 The perianal and digital rectal examinations were                            normal.                           Two sessile polyps were found in the descending                            colon and ascending colon. The polyps were                            diminutive in size. These polyps were removed with  a cold snare. Resection and retrieval were                            complete. Verification of patient identification                            for the specimen was done. Estimated blood loss was                            minimal.                           Multiple large-mouthed, medium-mouthed and                            small-mouthed diverticula were found in the sigmoid                            colon. There was narrowing of the colon in                            association with the diverticular opening.                           The exam was otherwise without abnormality on                            direct and retroflexion views except for                            rectosigmoid tattoo. Complications:            No immediate complications. Estimated Blood Loss:     Estimated blood loss was minimal. Impression:               -  Two diminutive polyps in the descending colon and                            in the ascending colon, removed with a cold snare.                            Resected and retrieved.                           - Severe diverticulosis in the sigmoid colon. There                            was narrowing of the colon in association with the                            diverticular opening.                           - The examination was otherwise normal on direct  and retroflexion views except for rectosigmoid                            tattoo (malignant polypectomy site 2020).                           - Personal history of malignant neoplasm of the                            colon. Malignant polyp removed 2020, other adenomas                            also. No polyps 2022. Recommendation:           - Patient has a contact number available for                            emergencies. The signs and symptoms of potential                            delayed complications were discussed with the                            patient. Return to normal activities tomorrow.                            Written discharge instructions were provided to the                            patient.                           - Resume previous diet.                           - Continue present medications.                           - Await pathology results.                           - Repeat colonoscopy in 5 years for surveillance. Lupita FORBES Commander, MD 12/19/2023 12:05:32 PM This report has been signed electronically.

## 2023-12-19 NOTE — Progress Notes (Signed)
 Called to room to assist during endoscopic procedure.  Patient ID and intended procedure confirmed with present staff. Received instructions for my participation in the procedure from the performing physician.

## 2023-12-20 ENCOUNTER — Telehealth: Payer: Self-pay

## 2023-12-20 NOTE — Telephone Encounter (Signed)
  Follow up Call-     12/19/2023   10:31 AM  Call back number  Post procedure Call Back phone  # [phone]  Permission to leave phone message Yes     Patient questions:  Do you have a fever, pain , or abdominal swelling? No. Pain Score  0 *  Have you tolerated food without any problems? Yes.    Have you been able to return to your normal activities? Yes.    Do you have any questions about your discharge instructions: Diet   No. Medications  No. Follow up visit  No.  Do you have questions or concerns about your Care? No.  Actions: * If pain score is 4 or above: No action needed, pain <4.

## 2023-12-21 LAB — SURGICAL PATHOLOGY

## 2023-12-23 ENCOUNTER — Ambulatory Visit: Payer: Self-pay | Admitting: Internal Medicine
